# Patient Record
Sex: Male | Born: 1935 | Race: White | Hispanic: No | Smoking: Former smoker
Health system: Southern US, Community
[De-identification: ages and names within clinical notes are randomized; demographics above are authoritative.]

## PROBLEM LIST (undated history)

## (undated) DIAGNOSIS — K5790 Diverticulosis of intestine, part unspecified, without perforation or abscess without bleeding: Secondary | ICD-10-CM

## (undated) DIAGNOSIS — M719 Bursopathy, unspecified: Secondary | ICD-10-CM

## (undated) DIAGNOSIS — E785 Hyperlipidemia, unspecified: Secondary | ICD-10-CM

## (undated) DIAGNOSIS — N400 Enlarged prostate without lower urinary tract symptoms: Secondary | ICD-10-CM

## (undated) DIAGNOSIS — I4891 Unspecified atrial fibrillation: Secondary | ICD-10-CM

## (undated) DIAGNOSIS — D649 Anemia, unspecified: Secondary | ICD-10-CM

## (undated) DIAGNOSIS — M4644 Discitis, unspecified, thoracic region: Secondary | ICD-10-CM

## (undated) DIAGNOSIS — N289 Disorder of kidney and ureter, unspecified: Secondary | ICD-10-CM

## (undated) DIAGNOSIS — I10 Essential (primary) hypertension: Secondary | ICD-10-CM

## (undated) DIAGNOSIS — R609 Edema, unspecified: Secondary | ICD-10-CM

## (undated) DIAGNOSIS — K219 Gastro-esophageal reflux disease without esophagitis: Secondary | ICD-10-CM

## (undated) DIAGNOSIS — N529 Male erectile dysfunction, unspecified: Secondary | ICD-10-CM

## (undated) DIAGNOSIS — K5900 Constipation, unspecified: Secondary | ICD-10-CM

## (undated) HISTORY — PX: TOTAL KNEE ARTHROPLASTY: SHX125

## (undated) HISTORY — DX: Edema, unspecified: R60.9

## (undated) HISTORY — DX: Hyperlipidemia, unspecified: E78.5

## (undated) HISTORY — DX: Discitis, unspecified, thoracic region: M46.44

## (undated) HISTORY — DX: Unspecified atrial fibrillation: I48.91

## (undated) HISTORY — DX: Diverticulosis of intestine, part unspecified, without perforation or abscess without bleeding: K57.90

## (undated) HISTORY — DX: Bursopathy, unspecified: M71.9

## (undated) HISTORY — DX: Benign prostatic hyperplasia without lower urinary tract symptoms: N40.0

## (undated) HISTORY — PX: TONSILLECTOMY: SUR1361

## (undated) HISTORY — PX: BUNIONECTOMY: SHX129

---

## 2008-04-25 ENCOUNTER — Emergency Department (HOSPITAL_BASED_OUTPATIENT_CLINIC_OR_DEPARTMENT_OTHER): Admission: EM | Admit: 2008-04-25 | Discharge: 2008-04-25 | Payer: Self-pay | Admitting: Emergency Medicine

## 2008-04-27 ENCOUNTER — Emergency Department (HOSPITAL_BASED_OUTPATIENT_CLINIC_OR_DEPARTMENT_OTHER): Admission: EM | Admit: 2008-04-27 | Discharge: 2008-04-27 | Payer: Self-pay | Admitting: Emergency Medicine

## 2008-05-04 ENCOUNTER — Emergency Department (HOSPITAL_BASED_OUTPATIENT_CLINIC_OR_DEPARTMENT_OTHER): Admission: EM | Admit: 2008-05-04 | Discharge: 2008-05-04 | Payer: Self-pay | Admitting: Emergency Medicine

## 2008-06-03 ENCOUNTER — Emergency Department (HOSPITAL_BASED_OUTPATIENT_CLINIC_OR_DEPARTMENT_OTHER): Admission: EM | Admit: 2008-06-03 | Discharge: 2008-06-03 | Payer: Self-pay | Admitting: Emergency Medicine

## 2008-06-11 ENCOUNTER — Inpatient Hospital Stay (HOSPITAL_COMMUNITY): Admission: RE | Admit: 2008-06-11 | Discharge: 2008-06-13 | Payer: Self-pay | Admitting: Urology

## 2008-06-11 ENCOUNTER — Encounter (INDEPENDENT_AMBULATORY_CARE_PROVIDER_SITE_OTHER): Payer: Self-pay | Admitting: Urology

## 2009-03-03 ENCOUNTER — Ambulatory Visit: Payer: Self-pay | Admitting: Diagnostic Radiology

## 2009-03-03 ENCOUNTER — Emergency Department (HOSPITAL_BASED_OUTPATIENT_CLINIC_OR_DEPARTMENT_OTHER): Admission: EM | Admit: 2009-03-03 | Discharge: 2009-03-03 | Payer: Self-pay | Admitting: Emergency Medicine

## 2009-03-08 ENCOUNTER — Emergency Department (HOSPITAL_COMMUNITY): Admission: EM | Admit: 2009-03-08 | Discharge: 2009-03-08 | Payer: Self-pay | Admitting: Emergency Medicine

## 2010-06-24 ENCOUNTER — Emergency Department (HOSPITAL_BASED_OUTPATIENT_CLINIC_OR_DEPARTMENT_OTHER)
Admission: EM | Admit: 2010-06-24 | Discharge: 2010-06-24 | Payer: Self-pay | Source: Home / Self Care | Admitting: Emergency Medicine

## 2010-06-25 LAB — DIFFERENTIAL
Basophils Absolute: 0 10*3/uL (ref 0.0–0.1)
Basophils Relative: 0 % (ref 0–1)
Eosinophils Absolute: 0.2 10*3/uL (ref 0.0–0.7)
Eosinophils Relative: 3 % (ref 0–5)
Lymphocytes Relative: 23 % (ref 12–46)
Lymphs Abs: 1.6 10*3/uL (ref 0.7–4.0)
Monocytes Absolute: 0.6 10*3/uL (ref 0.1–1.0)
Monocytes Relative: 9 % (ref 3–12)
Neutro Abs: 4.3 10*3/uL (ref 1.7–7.7)
Neutrophils Relative %: 64 % (ref 43–77)

## 2010-06-25 LAB — BASIC METABOLIC PANEL
BUN: 22 mg/dL (ref 6–23)
CO2: 25 mEq/L (ref 19–32)
Calcium: 8.9 mg/dL (ref 8.4–10.5)
Chloride: 107 mEq/L (ref 96–112)
Creatinine, Ser: 1 mg/dL (ref 0.4–1.5)
GFR calc Af Amer: >60 mL/min (ref >60)
GFR calc non Af Amer: >60 mL/min (ref >60)
Glucose, Bld: 230 mg/dL — ABNORMAL HIGH (ref 70–99)
Potassium: 4.2 mEq/L (ref 3.5–5.1)
Sodium: 146 mEq/L — ABNORMAL HIGH (ref 135–145)

## 2010-06-25 LAB — CBC
HCT: 39.9 % (ref 39.0–52.0)
Hemoglobin: 13.7 g/dL (ref 13.0–17.0)
MCH: 31.4 pg (ref 26.0–34.0)
MCHC: 34.3 g/dL (ref 30.0–36.0)
MCV: 91.3 fL (ref 78.0–100.0)
Platelets: 221 10*3/uL (ref 150–400)
RBC: 4.37 MIL/uL (ref 4.22–5.81)
RDW: 12.3 % (ref 11.5–15.5)
WBC: 6.7 10*3/uL (ref 4.0–10.5)

## 2010-06-25 LAB — POCT CARDIAC MARKERS
CKMB, poc: 1.9 ng/mL (ref 1.0–8.0)
Myoglobin, poc: 57.7 ng/mL (ref 12–200)
Troponin i, poc: <0.05 ng/mL (ref 0.00–0.09)

## 2010-09-22 LAB — BASIC METABOLIC PANEL
BUN: 14 mg/dL (ref 6–23)
BUN: 15 mg/dL (ref 6–23)
CO2: 29 mEq/L (ref 19–32)
CO2: 29 mEq/L (ref 19–32)
Calcium: 7.9 mg/dL — ABNORMAL LOW (ref 8.4–10.5)
Calcium: 8.6 mg/dL (ref 8.4–10.5)
Chloride: 100 mEq/L (ref 96–112)
Chloride: 104 mEq/L (ref 96–112)
Creatinine, Ser: 1 mg/dL (ref 0.4–1.5)
Creatinine, Ser: 1.01 mg/dL (ref 0.4–1.5)
GFR calc Af Amer: >60 mL/min (ref >60)
GFR calc Af Amer: >60 mL/min (ref >60)
GFR calc non Af Amer: >60 mL/min (ref >60)
GFR calc non Af Amer: >60 mL/min (ref >60)
Glucose, Bld: 129 mg/dL — ABNORMAL HIGH (ref 70–99)
Glucose, Bld: 136 mg/dL — ABNORMAL HIGH (ref 70–99)
Potassium: 3.9 mEq/L (ref 3.5–5.1)
Potassium: 4.2 mEq/L (ref 3.5–5.1)
Sodium: 136 mEq/L (ref 135–145)
Sodium: 140 mEq/L (ref 135–145)

## 2010-09-22 LAB — CBC
HCT: 35.3 % — ABNORMAL LOW (ref 39.0–52.0)
HCT: 40.2 % (ref 39.0–52.0)
Hemoglobin: 12.2 g/dL — ABNORMAL LOW (ref 13.0–17.0)
Hemoglobin: 13.6 g/dL (ref 13.0–17.0)
MCHC: 33.8 g/dL (ref 30.0–36.0)
MCHC: 34.4 g/dL (ref 30.0–36.0)
MCV: 94.6 fL (ref 78.0–100.0)
MCV: 95.6 fL (ref 78.0–100.0)
Platelets: 197 10*3/uL (ref 150–400)
Platelets: 234 10*3/uL (ref 150–400)
RBC: 3.73 MIL/uL — ABNORMAL LOW (ref 4.22–5.81)
RBC: 4.2 MIL/uL — ABNORMAL LOW (ref 4.22–5.81)
RDW: 13 % (ref 11.5–15.5)
RDW: 13 % (ref 11.5–15.5)
WBC: 6.1 10*3/uL (ref 4.0–10.5)
WBC: 7 10*3/uL (ref 4.0–10.5)

## 2010-10-21 NOTE — Discharge Summary (Signed)
[name], Gary               ACCOUNT NO.:  [id number]   MEDICAL RECORD NO.:  [id number]          PATIENT TYPE:  INP   LOCATION:                               FACILITY:  [facility]   PHYSICIAN:  Sigmund I. Patsi Sears, M.D.DATE OF BIRTH:  21-Aug-1922   DATE OF ADMISSION:  06/11/2008  DATE OF DISCHARGE:  06/13/2008                               DISCHARGE SUMMARY   IDENTIFICATION:  This is an 77 year old gentleman with history of BPH,  status post two resections in [place].  He presented to clinic with  voiding dysfunction, was evaluated and seen to have bladder stones, as  well as regrowth of his benign prostatic hyperplasia.  After discussion  of the risks and benefits, as well as alternatives, the patient elected  to proceed with elective transurethral resection of the prostate and  removal of bladder stones.   HOSPITAL COURSE:  The patient was brought back to the operating room on  June 11, 2008, for the aforementioned procedure.  For further details  of the operation, please see the operative report dictated that day.  Postoperatively, a 22-French hematuria catheter was placed.  He did well  on hospital day #1 and #2.  His diet was advanced to a regular diet.  He  was back to his baseline level of activity.  His urine remained light  pink and cleared up nicely on postoperative day #2, at which point it  was removed and he passed a voiding trial.  At this point, he is  considered appropriate for discharge.  He was discharged home in good  condition.   PROCEDURES DURING THIS HOSPITALIZATION:  Transurethral resection of  prostate and cystolithalopaxy done on June 11, 2008.   DISCHARGE MEDICATIONS:  Please see discharge medication reconciliation  sheet.      Gary Kitten, MD      Sigmund I. Patsi Sears, M.D.  Electronically Signed    DW/MEDQ  D:  06/13/2008  T:  06/13/2008  Job:  [id number]

## 2010-10-21 NOTE — Op Note (Signed)
Gary [name], Gary [name]               ACCOUNT NO.:  [id number]   MEDICAL RECORD NO.:  [id number]          PATIENT TYPE:  INP   LOCATION:  1421                         FACILITY:  [facility]   PHYSICIAN:  Sigmund I. Patsi Sears, M.D.DATE OF BIRTH:  1922-12-17   DATE OF PROCEDURE:  06/11/2008  DATE OF DISCHARGE:                               OPERATIVE REPORT   SURGEON:  Dr. Patsi Sears   ASSISTANT:  Dr. Allena Katz.   PREOPERATIVE DIAGNOSES:  1. Recurrent benign prostatic hyperplasia, status post two previous      transurethral resections of the prostate.  2. Bladder stones.   POSTOPERATIVE DIAGNOSES:  1. Recurrent benign prostatic hyperplasia, status post two previous      transurethral resections of the prostate.  2. Bladder stones.   PROCEDURES:  1. Pan cystourethroscopy.  2. Endoscopic cystolithalopaxy.  3. Transurethral resection of the prostate.   ANESTHESIA:  General.   INDICATIONS:  This is an 77 year old gentleman with persistent voiding  dysfunction.  Urodynamics was done in the office and it showed a  slightly low capacity bladder with detrusor instability.  Furthermore,  stones were seen on fluoroscopy, as well as office cystoscopy.  He had a  component of bladder outlet obstruction as well due to diminished flow.  As such, based on the entire picture, he elected to proceed with a redo  TURP and cystolithalopaxy.  The risks and benefits were discussed with  the patient preoperatively.   PROCEDURE IN DETAIL:  The patient was brought back to the operating  room.  After the successful induction of LMA anesthetic, he was placed  in the dorsolithotomy position and all pressure points were padded  appropriately.  A preoperative timeout was performed and he received  preoperative antibiotics.   A 22-French pan cystourethroscopy was performed.  The prostatic urethra  showed evidence of bilobar hypertrophy.  There is no appreciable median  lobe.  Both ureteral orifices were identified  at the lateral aspect of  the trigone effluxing clear urine.  At the dependent portion of the  bladder, there were approximately 8-10 bladder stones, the biggest being  1-cm in diameter.   At this point, the stones were crushed using the aid of a stone crusher  and removed with endoscopic cystoscope sheath.  Once the adequate stones  had been removed, we proceeded by placing a 28-French resectoscope  sheath into the bladder.   Using the continuous flow resectoscope, we resected the prostate down to  the capsule circumferentially.  Of note, there was evidence of  dystrophic prostatic calcifications near the bladder neck that were  resected as well.  Bleeding was fulgurated.  At this point, the bladder  was inspected.  Both ureteral orifices were identified and inspected.  The bladder was drained of all the TURP fragments and residual  stones/dystrophic calcification.  At this point, a 22-French Silastic  catheter was placed.  The balloon was inflated to 30 mL of water and  light pink urine was seen effluxing.  At this point, the procedure was  ended.  The Foley was placed on traction.  Please note, Dr. Patsi Sears  was present throughout the entirety of the case.   ESTIMATED BLOOD LOSS:  Minimal.   URINE OUTPUT:  Not recorded.   DRAINS:  A #22 [place] Silastic catheter.   SPECIMENS:  1. Transurethral resection of prostate chips.  2. Bladder stone.      Delman Kitten, MD      Sigmund I. Patsi Sears, M.D.  Electronically Signed    DW/MEDQ  D:  06/11/2008  T:  06/11/2008  Job:  [id number]

## 2011-03-11 LAB — BASIC METABOLIC PANEL
BUN: 27 — ABNORMAL HIGH
CO2: 26
Calcium: 8.5
Chloride: 105
Creatinine, Ser: 1
GFR calc Af Amer: >60
GFR calc non Af Amer: >60
Glucose, Bld: 131 — ABNORMAL HIGH
Potassium: 3.9
Sodium: 139

## 2011-03-11 LAB — URINE MICROSCOPIC-ADD ON

## 2011-03-11 LAB — URINALYSIS, ROUTINE W REFLEX MICROSCOPIC
Bilirubin Urine: NEGATIVE
Glucose, UA: NEGATIVE
Ketones, ur: NEGATIVE
Leukocytes, UA: NEGATIVE
Nitrite: NEGATIVE
Protein, ur: 30 — AB
Specific Gravity, Urine: 1.018
Urobilinogen, UA: 0.2
pH: 6.5

## 2011-03-13 LAB — BASIC METABOLIC PANEL
BUN: 17 mg/dL (ref 6–23)
CO2: 25 mEq/L (ref 19–32)
Calcium: 8.7 mg/dL (ref 8.4–10.5)
Chloride: 106 mEq/L (ref 96–112)
Creatinine, Ser: 1.09 mg/dL (ref 0.4–1.5)
GFR calc Af Amer: >60 mL/min (ref >60)
GFR calc non Af Amer: >60 mL/min (ref >60)
Glucose, Bld: 168 mg/dL — ABNORMAL HIGH (ref 70–99)
Potassium: 3.9 mEq/L (ref 3.5–5.1)
Sodium: 141 mEq/L (ref 135–145)

## 2011-03-13 LAB — HEMOGLOBIN AND HEMATOCRIT, BLOOD
HCT: 39.9 % (ref 39.0–52.0)
Hemoglobin: 13.6 g/dL (ref 13.0–17.0)

## 2011-11-07 ENCOUNTER — Emergency Department (HOSPITAL_BASED_OUTPATIENT_CLINIC_OR_DEPARTMENT_OTHER)
Admission: EM | Admit: 2011-11-07 | Discharge: 2011-11-07 | Disposition: A | Discharge status: Home / Self Care | Payer: Medicare Other | Attending: Emergency Medicine | Admitting: Emergency Medicine

## 2011-11-07 ENCOUNTER — Encounter (HOSPITAL_BASED_OUTPATIENT_CLINIC_OR_DEPARTMENT_OTHER): Payer: Self-pay | Admitting: *Deleted

## 2011-11-07 DIAGNOSIS — I10 Essential (primary) hypertension: Secondary | ICD-10-CM | POA: Insufficient documentation

## 2011-11-07 DIAGNOSIS — L259 Unspecified contact dermatitis, unspecified cause: Secondary | ICD-10-CM

## 2011-11-07 DIAGNOSIS — K219 Gastro-esophageal reflux disease without esophagitis: Secondary | ICD-10-CM | POA: Insufficient documentation

## 2011-11-07 DIAGNOSIS — Z87891 Personal history of nicotine dependence: Secondary | ICD-10-CM | POA: Insufficient documentation

## 2011-11-07 DIAGNOSIS — E119 Type 2 diabetes mellitus without complications: Secondary | ICD-10-CM | POA: Insufficient documentation

## 2011-11-07 DIAGNOSIS — R21 Rash and other nonspecific skin eruption: Secondary | ICD-10-CM | POA: Insufficient documentation

## 2011-11-07 HISTORY — DX: Essential (primary) hypertension: I10

## 2011-11-07 HISTORY — DX: Gastro-esophageal reflux disease without esophagitis: K21.9

## 2011-11-07 HISTORY — DX: Male erectile dysfunction, unspecified: N52.9

## 2011-11-07 MED ORDER — DIPHENHYDRAMINE HCL 25 MG PO TABS: 25.0000 mg | ORAL_TABLET | Freq: Four times a day (QID) | ORAL | Status: AC

## 2011-11-07 MED ORDER — HYDROCORTISONE 1 % EX CREA: TOPICAL_CREAM | CUTANEOUS | Status: AC

## 2011-11-07 NOTE — ED Notes (Signed)
patient c/o itching/rash on boths hands and R thigh

## 2011-11-07 NOTE — ED Provider Notes (Signed)
History     CSN: [id number]  Arrival date & time 11/07/11  8657   First MD Initiated Contact with Patient 11/07/11 [phone]      Chief Complaint  Patient presents with  . Rash    (Consider location/radiation/quality/duration/timing/severity/associated sxs/prior treatment) HPI Pt reports he has developed a rash on his hands in the last few days. It is itchy but not painful. Rash appeared on his L anterior and medial thigh this morning. He reports he was working in the yard about 5 days ago and suspects he may have been in contact with poison ivy/oak. He has not washed the clothes he was wearing that day and has worn them again since that time.  Past Medical History  Diagnosis Date  . Diabetes mellitus   . Hypertension   . Acid reflux   . Erectile dysfunction     Past Surgical History  Procedure Date  . Total knee arthroplasty     L    No family history on file.  History  Substance Use Topics  . Smoking status: Former Games developer  . Smokeless tobacco: Not on file  . Alcohol Use: No      Review of Systems All other systems reviewed and are negative except as noted in HPI.   Allergies  Keflex  Home Medications   Current Outpatient Rx  Name Route Sig Dispense Refill  . AMLODIPINE BESYLATE 10 MG PO TABS Oral Take 10 mg by mouth daily.    . ASPIRIN 81 MG PO TABS Oral Take 81 mg by mouth daily.    . ATENOLOL 50 MG PO TABS Oral Take 50 mg by mouth daily.    Marland Kitchen DOCUSATE SODIUM 100 MG PO CAPS Oral Take 100 mg by mouth 2 (two) times daily.    Marland Kitchen GLUCOSAMINE-CHONDROITIN 500-400 MG PO TABS Oral Take 1 tablet by mouth 3 (three) times daily.    Marland Kitchen METFORMIN HCL 500 MG PO TABS Oral Take 500 mg by mouth 2 (two) times daily with a meal.    . PANTOPRAZOLE SODIUM 40 MG PO TBEC Oral Take 40 mg by mouth daily.    Marland Kitchen TAMSULOSIN HCL 0.4 MG PO CAPS Oral Take by mouth.    Randa Ngo TD Transdermal Place onto the skin.      BP 133/59  Pulse 47  Temp(Src) 98.4 F (36.9 C) (Oral)  Resp 18   SpO2 99%  Physical Exam  Nursing note and vitals reviewed. Constitutional: He is oriented to person, place, and time. He appears well-developed and well-nourished.  HENT:  Head: Normocephalic and atraumatic.  Eyes: EOM are normal. Pupils are equal, round, and reactive to light.  Neck: Normal range of motion. Neck supple.  Cardiovascular: Normal rate, normal heart sounds and intact distal pulses.   Pulmonary/Chest: Effort normal and breath sounds normal.  Abdominal: Bowel sounds are normal. He exhibits no distension. There is no tenderness.  Musculoskeletal: Normal range of motion. He exhibits no edema and no tenderness.  Neurological: He is alert and oriented to person, place, and time. He has normal strength. No cranial nerve deficit or sensory deficit.  Skin: Skin is warm and dry. Rash (erythematous rash with small vessicles on hands, particularly in webspaces and also on anterior/medial thigh) noted.  Psychiatric: He has a normal mood and affect.    ED Course  Procedures (including critical care time)     MDM  Rash is in a non-dermatomal distribution and is not characteristic of zoster. Rash has the appearance of  a contact dermatitis, unsure of source but suspect poison ivy/oak. Pt is diabetic, so will avoid systemic steroids for now. Treat with topical hydrocortisone and oral antihistamines.         Jakayla Schweppe B. Bernette Mayers, MD 11/07/11 2130

## 2011-11-07 NOTE — Discharge Instructions (Signed)

## 2012-04-01 ENCOUNTER — Encounter (HOSPITAL_BASED_OUTPATIENT_CLINIC_OR_DEPARTMENT_OTHER): Payer: Self-pay | Admitting: *Deleted

## 2012-04-01 ENCOUNTER — Emergency Department (HOSPITAL_BASED_OUTPATIENT_CLINIC_OR_DEPARTMENT_OTHER)
Admission: EM | Admit: 2012-04-01 | Discharge: 2012-04-01 | Disposition: A | Discharge status: Home / Self Care | Payer: Medicare Other | Attending: Emergency Medicine | Admitting: Emergency Medicine

## 2012-04-01 DIAGNOSIS — Z87891 Personal history of nicotine dependence: Secondary | ICD-10-CM | POA: Insufficient documentation

## 2012-04-01 DIAGNOSIS — N529 Male erectile dysfunction, unspecified: Secondary | ICD-10-CM | POA: Insufficient documentation

## 2012-04-01 DIAGNOSIS — Z8719 Personal history of other diseases of the digestive system: Secondary | ICD-10-CM | POA: Insufficient documentation

## 2012-04-01 DIAGNOSIS — I10 Essential (primary) hypertension: Secondary | ICD-10-CM | POA: Insufficient documentation

## 2012-04-01 DIAGNOSIS — Z87448 Personal history of other diseases of urinary system: Secondary | ICD-10-CM | POA: Insufficient documentation

## 2012-04-01 DIAGNOSIS — Z79899 Other long term (current) drug therapy: Secondary | ICD-10-CM | POA: Insufficient documentation

## 2012-04-01 DIAGNOSIS — K5900 Constipation, unspecified: Secondary | ICD-10-CM

## 2012-04-01 DIAGNOSIS — Z7982 Long term (current) use of aspirin: Secondary | ICD-10-CM | POA: Insufficient documentation

## 2012-04-01 DIAGNOSIS — Z9889 Other specified postprocedural states: Secondary | ICD-10-CM | POA: Insufficient documentation

## 2012-04-01 DIAGNOSIS — E119 Type 2 diabetes mellitus without complications: Secondary | ICD-10-CM | POA: Insufficient documentation

## 2012-04-01 DIAGNOSIS — K219 Gastro-esophageal reflux disease without esophagitis: Secondary | ICD-10-CM | POA: Insufficient documentation

## 2012-04-01 MED ORDER — FLEET ENEMA 7-19 GM/118ML RE ENEM: 1.0000 | ENEMA | Freq: Once | RECTAL | Status: DC

## 2012-04-01 MED ORDER — MINERAL OIL RE ENEM
1.0000 | ENEMA | Freq: Once | RECTAL | Status: AC
  Administered 2012-04-01: 1 via RECTAL
  Filled 2012-04-01: qty 1

## 2012-04-01 NOTE — ED Notes (Signed)
Constipation. Last bowel movement 3 days ago. Lives at [name] Took 3 stool softeners without results.

## 2012-04-01 NOTE — ED Provider Notes (Addendum)
History     CSN: [id number]  Arrival date & time 04/01/12  1543   First MD Initiated Contact with Patient 04/01/12 1608      Chief Complaint  Patient presents with  . Constipation    (Consider location/radiation/quality/duration/timing/severity/associated sxs/prior treatment) HPI Comments: Patient recently went on vacation where he was not exercising regularly and his diet was different. He thinks this is most likely the reason for his constipation. He states that he took stool softeners yesterday and today without any bowel movements. He states in the past he is taking Colace and that has worked but he did not have any available.  Patient is a 78 y.o. male presenting with constipation. The history is provided by the patient.  Constipation  The current episode started 3 to 5 days ago. The onset was gradual. The problem occurs continuously. The problem has been gradually worsening. The patient is experiencing no pain. The stool is described as hard. Prior successful therapies include laxatives. Prior unsuccessful therapies include stool softeners. Pertinent negatives include no anorexia, no fever, no abdominal pain, no diarrhea, no nausea, no rectal pain and no vomiting. He has been eating and drinking normally. The last void occurred less than 6 hours ago.    Past Medical History  Diagnosis Date  . Diabetes mellitus   . Hypertension   . Acid reflux   . Erectile dysfunction     Past Surgical History  Procedure Date  . Total knee arthroplasty     L    No family history on file.  History  Substance Use Topics  . Smoking status: Former Games developer  . Smokeless tobacco: Not on file  . Alcohol Use: No      Review of Systems  Constitutional: Negative for fever.  Gastrointestinal: Positive for constipation. Negative for nausea, vomiting, abdominal pain, diarrhea, rectal pain and anorexia.  All other systems reviewed and are negative.    Allergies  Keflex  Home Medications    Current Outpatient Rx  Name Route Sig Dispense Refill  . AMLODIPINE BESYLATE 10 MG PO TABS Oral Take 10 mg by mouth daily.    . ASPIRIN 81 MG PO TABS Oral Take 81 mg by mouth daily.    . ATENOLOL 50 MG PO TABS Oral Take 50 mg by mouth daily.    Marland Kitchen DIPHENHYDRAMINE HCL 25 MG PO TABS Oral Take 1 tablet (25 mg total) by mouth every 6 (six) hours. 20 tablet 0  . DOCUSATE SODIUM 100 MG PO CAPS Oral Take 100 mg by mouth 2 (two) times daily.    Marland Kitchen GLUCOSAMINE-CHONDROITIN 500-400 MG PO TABS Oral Take 1 tablet by mouth 3 (three) times daily.    Marland Kitchen HYDROCORTISONE 1 % EX CREA  Apply to affected area 2 times daily 15 g 0  . METFORMIN HCL 500 MG PO TABS Oral Take 500 mg by mouth 2 (two) times daily with a meal.    . PANTOPRAZOLE SODIUM 40 MG PO TBEC Oral Take 40 mg by mouth daily.    Marland Kitchen TAMSULOSIN HCL 0.4 MG PO CAPS Oral Take by mouth.    Randa Ngo TD Transdermal Place onto the skin.      BP 118/65  Pulse 62  Temp 97.7 F (36.5 C) (Oral)  Resp 16  Ht 5\' 10"  (1.778 m)  Wt 178 lb (80.74 kg)  BMI 25.54 kg/m2  SpO2 96%  Physical Exam  Nursing note and vitals reviewed. Constitutional: He is oriented to person, place, and time. He appears  well-developed and well-nourished. No distress.  HENT:  Head: Normocephalic and atraumatic.  Mouth/Throat: Oropharynx is clear and moist.  Eyes: Conjunctivae normal and EOM are normal. Pupils are equal, round, and reactive to light.  Neck: Normal range of motion. Neck supple.  Cardiovascular: Normal rate, regular rhythm and intact distal pulses.   No murmur heard. Pulmonary/Chest: Effort normal and breath sounds normal. No respiratory distress. He has no wheezes. He has no rales.  Abdominal: Soft. He exhibits no distension. There is no tenderness. There is no rebound and no guarding.  Genitourinary:       Large amount of hard stool at the fingertip  Musculoskeletal: Normal range of motion. He exhibits no edema and no tenderness.  Neurological: He is alert and  oriented to person, place, and time.  Skin: Skin is warm and dry. No rash noted. No erythema.  Psychiatric: He has a normal mood and affect. His behavior is normal.    ED Course  Procedures (including critical care time)  Labs Reviewed - No data to display No results found.   1. Constipation       MDM   Patient with symptoms today most consistent with uncomplicated constipation and fecal impaction. Large amount of hard stool it fingertip was unable to be removed manually. Patient has no abdominal tenderness is not vomiting and is otherwise well-appearing. Will give enema to aid patient in removing large stool ball.  5:26 PM Pt had a large BM after enema and feels much better.  Since he had significant relief will have him continue stool softeners.     Gwyneth Sprout, MD 04/01/12 1727  Gwyneth Sprout, MD 04/01/12 1729

## 2012-12-05 ENCOUNTER — Ambulatory Visit (INDEPENDENT_AMBULATORY_CARE_PROVIDER_SITE_OTHER): Payer: Medicare Other | Admitting: Cardiology

## 2012-12-05 ENCOUNTER — Encounter: Payer: Self-pay | Admitting: Cardiology

## 2012-12-05 VITALS — BP 141/77 | HR 38 | Wt 174.0 lb

## 2012-12-05 DIAGNOSIS — R0602 Shortness of breath: Secondary | ICD-10-CM

## 2012-12-05 DIAGNOSIS — K219 Gastro-esophageal reflux disease without esophagitis: Secondary | ICD-10-CM | POA: Insufficient documentation

## 2012-12-05 DIAGNOSIS — R609 Edema, unspecified: Secondary | ICD-10-CM

## 2012-12-05 DIAGNOSIS — I4891 Unspecified atrial fibrillation: Secondary | ICD-10-CM | POA: Insufficient documentation

## 2012-12-05 DIAGNOSIS — N529 Male erectile dysfunction, unspecified: Secondary | ICD-10-CM | POA: Insufficient documentation

## 2012-12-05 DIAGNOSIS — M4644 Discitis, unspecified, thoracic region: Secondary | ICD-10-CM | POA: Insufficient documentation

## 2012-12-05 DIAGNOSIS — I10 Essential (primary) hypertension: Secondary | ICD-10-CM

## 2012-12-05 MED ORDER — FUROSEMIDE 20 MG PO TABS: 40.0000 mg | ORAL_TABLET | Freq: Every day | ORAL | Status: DC

## 2012-12-05 MED ORDER — ATENOLOL 25 MG PO TABS: 25.0000 mg | ORAL_TABLET | Freq: Two times a day (BID) | ORAL | Status: DC

## 2012-12-05 NOTE — Assessment & Plan Note (Signed)
Continue present blood pressure medications but decrease atenolol because of bradycardia.

## 2012-12-05 NOTE — Progress Notes (Signed)
HPI: 79 year old male for evaluation of atrial fibrillation. Patient states that for the past 2 months he has had bilateral lower extremity edema. He has been placed on low-dose diuretics but his edema has persisted. He also notes mild increased dyspnea with more vigorous activities but not routine activities. No orthopnea, PND, chest pain, palpitations or syncope. He was seen by his primary care physician and also noted to be in atrial fibrillation and cardiology asked to evaluate.  Current Outpatient Prescriptions  Medication Sig Dispense Refill  . amLODipine (NORVASC) 10 MG tablet Take 10 mg by mouth daily.      Marland Kitchen atenolol (TENORMIN) 25 MG tablet Take 1 tablet (25 mg total) by mouth 2 (two) times daily.      . diphenhydrAMINE (BENADRYL) 25 MG tablet Take 1 tablet (25 mg total) by mouth every 6 (six) hours.  20 tablet  0  . docusate sodium (COLACE) 100 MG capsule Take 100 mg by mouth 2 (two) times daily.      . furosemide (LASIX) 20 MG tablet Take 2 tablets (40 mg total) by mouth daily.  30 tablet    . glucosamine-chondroitin 500-400 MG tablet Take 1 tablet by mouth 3 (three) times daily.      . metFORMIN (GLUCOPHAGE) 500 MG tablet Take 500 mg by mouth 2 (two) times daily with a meal.      . pantoprazole (PROTONIX) 40 MG tablet Take 40 mg by mouth daily.      . Tamsulosin HCl (FLOMAX) 0.4 MG CAPS Take 0.4 mg by mouth daily.        No current facility-administered medications for this visit.    Allergies  Allergen Reactions  . Keflex (Cephalexin)     Past Medical History  Diagnosis Date  . Diabetes mellitus   . Hypertension   . Acid reflux   . Erectile dysfunction   . Atrial fibrillation   . Bursitis   . Edema   . Thoracic discitis   . Hyperlipidemia   . Diverticulosis   . BPH (benign prostatic hyperplasia)     Past Surgical History  Procedure Laterality Date  . Total knee arthroplasty      L  . Tonsillectomy      History   Social History  . Marital Status: Widowed      Spouse Name: N/A    Number of Children: 3  . Years of Education: N/A   Occupational History  .     Social History Main Topics  . Smoking status: Former Games developer  . Smokeless tobacco: Not on file  . Alcohol Use: Yes     Comment: Occasional  . Drug Use: No  . Sexually Active: Not on file   Other Topics Concern  . Not on file   Social History Narrative  . No narrative on file    Family History  Problem Relation Age of Onset  . Heart disease Mother     ROS: no fevers or chills, productive cough, hemoptysis, dysphasia, odynophagia, melena, hematochezia, dysuria, hematuria, rash, seizure activity, orthopnea, PND, claudication. Remaining systems are negative.  Physical Exam:   Blood pressure 141/77, pulse 38, weight 174 lb (78.926 kg).  General:  Well developed/well nourished in NAD Skin warm/dry Patient not depressed No peripheral clubbing Back-normal HEENT-normal/normal eyelids Neck supple/normal carotid upstroke bilaterally; no bruits; no JVD; no thyromegaly chest - CTA/ normal expansion CV - irregular, bradycardic/normal S1 and S2; no rubs or gallops;  PMI nondisplaced; 1-2/6 systolic murmur apex. Abdomen -NT/ND, no HSM,  no mass, + bowel sounds, no bruit 2+ femoral pulses, no bruits Ext-2+ edema to mid tibia, no chords, 2+ DP Neuro-grossly nonfocal  ECG atrial fibrillation with a slow ventricular response, rate 38, no ST changes.

## 2012-12-05 NOTE — Assessment & Plan Note (Signed)
Change Lasix to 40 mg daily. Check potassium and renal function in one week.

## 2012-12-05 NOTE — Assessment & Plan Note (Signed)
The patient presents with new diagnosis of atrial fibrillation. His heart rate is slow. Decrease atenolol to 25 mg by mouth twice a day. He may ultimately need to have this medication discontinued. He has embolic risk factors of age greater than 24, hypertension and diabetes. Discontinue aspirin. Check renal function. Begin apixaban ( dose will need to be based on creatinine). Check TSH, renal function, hemoglobin and BNP. Check echocardiogram. Patient does have some dyspnea on exertion and pedal edema. Change Lasix to 40 mg daily. Repeat potassium and renal function in one week. We will decide about rate control/anticoagulation versus rhythm control when he follows up in 4 weeks. If he continues to have some dyspnea and edema despite diuresis and we may attempt cardioversion to see if he will hold sinus rhythm. We we'll plan to recheck renal function and hemoglobin in 4 weeks when he returns. He does have a history of a diverticular bleed but has had no hematochezia in several years.

## 2012-12-05 NOTE — Patient Instructions (Addendum)
Your physician recommends that you schedule a follow-up appointment in: 4 WEEKS WITH DR Jens Som   Your physician has requested that you have an echocardiogram. Echocardiography is a painless test that uses sound waves to create images of your heart. It provides your doctor with information about the size and shape of your heart and how well your heart's chambers and valves are working. This procedure takes approximately one hour. There are no restrictions for this procedure.   Your physician recommends that you HAVE LAB WORK TODAY  INCREASE FUROSEMIDE TO 40 MG ONCE DAILY (TWO 20 MG TABLETS ONCE DAILY)  Your physician recommends that you return for lab work in: ONE WEEK  STOP ASPIRIN  WILL CALL ABOUT STARTING ELIQUIS AFTER LAB WORK IS REVIEWED  DECREASE ATENOLOL TO 25 MG TWICE DAILY (1/2 50 MG TABLET TWICE DAILY)

## 2012-12-06 LAB — BASIC METABOLIC PANEL
CO2: 24 mEq/L (ref 19–32)
Calcium: 8.8 mg/dL (ref 8.4–10.5)
Chloride: 106 mEq/L (ref 96–112)
Creatinine, Ser: 1.4 mg/dL (ref 0.4–1.5)
Sodium: 141 mEq/L (ref 135–145)

## 2012-12-06 LAB — CBC WITH DIFFERENTIAL/PLATELET
Basophils Absolute: 0 10*3/uL (ref 0.0–0.1)
Eosinophils Absolute: 0.3 10*3/uL (ref 0.0–0.7)
HCT: 36.8 % — ABNORMAL LOW (ref 39.0–52.0)
Hemoglobin: 12.4 g/dL — ABNORMAL LOW (ref 13.0–17.0)
Lymphocytes Relative: 25.5 % (ref 12.0–46.0)
Lymphs Abs: 1.4 10*3/uL (ref 0.7–4.0)
MCV: 93.2 fl (ref 78.0–100.0)
Monocytes Relative: 10.4 % (ref 3.0–12.0)
Platelets: 251 10*3/uL (ref 150.0–400.0)
RBC: 3.95 Mil/uL — ABNORMAL LOW (ref 4.22–5.81)
WBC: 5.4 10*3/uL (ref 4.5–10.5)

## 2012-12-06 LAB — BRAIN NATRIURETIC PEPTIDE: Pro B Natriuretic peptide (BNP): 314 pg/mL — ABNORMAL HIGH (ref 0.0–100.0)

## 2012-12-06 LAB — TSH: TSH: 2.03 u[IU]/mL (ref 0.35–5.50)

## 2012-12-14 ENCOUNTER — Other Ambulatory Visit: Payer: Self-pay | Admitting: *Deleted

## 2012-12-14 ENCOUNTER — Other Ambulatory Visit (INDEPENDENT_AMBULATORY_CARE_PROVIDER_SITE_OTHER): Payer: Medicare Other

## 2012-12-14 ENCOUNTER — Ambulatory Visit (HOSPITAL_COMMUNITY): Payer: Medicare Other | Attending: Cardiology | Admitting: Radiology

## 2012-12-14 DIAGNOSIS — I4891 Unspecified atrial fibrillation: Secondary | ICD-10-CM | POA: Insufficient documentation

## 2012-12-14 DIAGNOSIS — E119 Type 2 diabetes mellitus without complications: Secondary | ICD-10-CM | POA: Insufficient documentation

## 2012-12-14 DIAGNOSIS — I079 Rheumatic tricuspid valve disease, unspecified: Secondary | ICD-10-CM | POA: Insufficient documentation

## 2012-12-14 DIAGNOSIS — E785 Hyperlipidemia, unspecified: Secondary | ICD-10-CM | POA: Insufficient documentation

## 2012-12-14 LAB — BASIC METABOLIC PANEL
CO2: 30 mEq/L (ref 19–32)
Calcium: 9.3 mg/dL (ref 8.4–10.5)
Creatinine, Ser: 1.7 mg/dL — ABNORMAL HIGH (ref 0.4–1.5)
GFR: 40.88 mL/min — ABNORMAL LOW (ref >60.00)
Sodium: 144 mEq/L (ref 135–145)

## 2012-12-14 MED ORDER — APIXABAN 5 MG PO TABS: 5.0000 mg | ORAL_TABLET | Freq: Two times a day (BID) | ORAL | Status: DC

## 2012-12-14 NOTE — Progress Notes (Signed)
Echocardiogram performed.  

## 2012-12-19 ENCOUNTER — Telehealth: Payer: Self-pay | Admitting: Cardiology

## 2012-12-19 ENCOUNTER — Other Ambulatory Visit: Payer: Self-pay | Admitting: *Deleted

## 2012-12-19 DIAGNOSIS — N289 Disorder of kidney and ureter, unspecified: Secondary | ICD-10-CM

## 2012-12-19 DIAGNOSIS — I4891 Unspecified atrial fibrillation: Secondary | ICD-10-CM

## 2012-12-19 MED ORDER — FUROSEMIDE 20 MG PO TABS: ORAL_TABLET | ORAL | Status: DC

## 2012-12-19 MED ORDER — APIXABAN 2.5 MG PO TABS: 2.5000 mg | ORAL_TABLET | Freq: Two times a day (BID) | ORAL | Status: DC

## 2012-12-19 NOTE — Telephone Encounter (Signed)
Follow Up ° ° ° ° °Pt calling following up on test results. Please call. °

## 2012-12-19 NOTE — Telephone Encounter (Signed)
Spoke with pt, aware of recent labs, med change, and echo results

## 2012-12-28 ENCOUNTER — Other Ambulatory Visit (INDEPENDENT_AMBULATORY_CARE_PROVIDER_SITE_OTHER): Payer: Medicare Other

## 2012-12-28 DIAGNOSIS — N289 Disorder of kidney and ureter, unspecified: Secondary | ICD-10-CM

## 2012-12-28 DIAGNOSIS — I4891 Unspecified atrial fibrillation: Secondary | ICD-10-CM

## 2012-12-28 LAB — BASIC METABOLIC PANEL
Chloride: 103 mEq/L (ref 96–112)
GFR: 39.78 mL/min — ABNORMAL LOW (ref >60.00)
Potassium: 4.5 mEq/L (ref 3.5–5.1)
Sodium: 140 mEq/L (ref 135–145)

## 2013-01-06 ENCOUNTER — Other Ambulatory Visit: Payer: Medicare Other

## 2013-01-06 ENCOUNTER — Encounter: Payer: Self-pay | Admitting: Cardiology

## 2013-01-06 ENCOUNTER — Ambulatory Visit (INDEPENDENT_AMBULATORY_CARE_PROVIDER_SITE_OTHER): Payer: Medicare Other | Admitting: Cardiology

## 2013-01-06 VITALS — BP 117/56 | HR 41 | Ht 70.0 in | Wt 165.6 lb

## 2013-01-06 DIAGNOSIS — R609 Edema, unspecified: Secondary | ICD-10-CM

## 2013-01-06 DIAGNOSIS — I4891 Unspecified atrial fibrillation: Secondary | ICD-10-CM

## 2013-01-06 DIAGNOSIS — I10 Essential (primary) hypertension: Secondary | ICD-10-CM

## 2013-01-06 LAB — CBC WITH DIFFERENTIAL/PLATELET
Basophils Absolute: 0 10*3/uL (ref 0.0–0.1)
Eosinophils Relative: 3.2 % (ref 0.0–5.0)
HCT: 43.2 % (ref 39.0–52.0)
Hemoglobin: 14.1 g/dL (ref 13.0–17.0)
Lymphs Abs: 1.4 10*3/uL (ref 0.7–4.0)
MCV: 94.2 fl (ref 78.0–100.0)
Monocytes Absolute: 0.5 10*3/uL (ref 0.1–1.0)
Monocytes Relative: 8.4 % (ref 3.0–12.0)
Neutro Abs: 4.3 10*3/uL (ref 1.4–7.7)
Platelets: 215 10*3/uL (ref 150.0–400.0)
RDW: 14.4 % (ref 11.5–14.6)

## 2013-01-06 LAB — BASIC METABOLIC PANEL
CO2: 33 mEq/L — ABNORMAL HIGH (ref 19–32)
Calcium: 9.3 mg/dL (ref 8.4–10.5)
Creatinine, Ser: 1.7 mg/dL — ABNORMAL HIGH (ref 0.4–1.5)
GFR: 41.15 mL/min — ABNORMAL LOW (ref >60.00)
Glucose, Bld: 123 mg/dL — ABNORMAL HIGH (ref 70–99)
Sodium: 140 mEq/L (ref 135–145)

## 2013-01-06 NOTE — Progress Notes (Signed)
   HPI: 79 year old male for fu of atrial fibrillation. Echocardiogram in July of 2014 showed normal LV function, mild aortic stenosis with a mean gradient of 14 mm of mercury. There was moderate left ventricular hypertrophy. TSH in June of 2014 normal. Since I last saw him he has some dyspnea on exertion but no orthopnea, PND, chest pain, palpitations or syncope. His pedal edema has improved.   Current Outpatient Prescriptions  Medication Sig Dispense Refill  . amLODipine (NORVASC) 10 MG tablet Take 10 mg by mouth daily.      Marland Kitchen apixaban (ELIQUIS) 2.5 MG TABS tablet Take 1 tablet (2.5 mg total) by mouth 2 (two) times daily.  60 tablet  6  . atenolol (TENORMIN) 25 MG tablet Take 1 tablet (25 mg total) by mouth 2 (two) times daily.      Marland Kitchen docusate sodium (COLACE) 100 MG capsule Take 100 mg by mouth 2 (two) times daily.      . furosemide (LASIX) 20 MG tablet 40 alternating with 20 mg po daily  60 tablet  2  . glucosamine-chondroitin 500-400 MG tablet Take 1 tablet by mouth 3 (three) times daily.      . metFORMIN (GLUCOPHAGE) 500 MG tablet Take 500 mg by mouth 2 (two) times daily with a meal.      . pantoprazole (PROTONIX) 40 MG tablet Take 40 mg by mouth daily.      . Tamsulosin HCl (FLOMAX) 0.4 MG CAPS Take 0.4 mg by mouth daily.       . diphenhydrAMINE (BENADRYL) 25 MG tablet Take 1 tablet (25 mg total) by mouth every 6 (six) hours.  20 tablet  0   No current facility-administered medications for this visit.     Past Medical History  Diagnosis Date  . Diabetes mellitus   . Hypertension   . Acid reflux   . Erectile dysfunction   . Atrial fibrillation   . Bursitis   . Edema   . Thoracic discitis   . Hyperlipidemia   . Diverticulosis   . BPH (benign prostatic hyperplasia)     Past Surgical History  Procedure Laterality Date  . Total knee arthroplasty      L  . Tonsillectomy      History   Social History  . Marital Status: Widowed    Spouse Name: N/A    Number of Children:  3  . Years of Education: N/A   Occupational History  .     Social History Main Topics  . Smoking status: Former Games developer  . Smokeless tobacco: Not on file  . Alcohol Use: Yes     Comment: Occasional  . Drug Use: No  . Sexually Active: Not on file   Other Topics Concern  . Not on file   Social History Narrative  . No narrative on file    ROS: no fevers or chills, productive cough, hemoptysis, dysphasia, odynophagia, melena, hematochezia, dysuria, hematuria, rash, seizure activity, orthopnea, PND, pedal edema, claudication. Remaining systems are negative.  Physical Exam: Well-developed well-nourished in no acute distress.  Skin is warm and dry.  HEENT is normal.  Neck is supple.  Chest is clear to auscultation with normal expansion.  Cardiovascular exam is bradycardic and irregular Abdominal exam nontender or distended. No masses palpated. Extremities show trace edema. neuro grossly intact  ECG atrial fibrillation with slow ventricular response at 41. Nonspecific ST changes.

## 2013-01-06 NOTE — Assessment & Plan Note (Signed)
Discontinue atenolol due to bradycardia. Continue Norvasc.

## 2013-01-06 NOTE — Assessment & Plan Note (Signed)
Continue Lasix. Check potassium and renal function. 

## 2013-01-06 NOTE — Assessment & Plan Note (Signed)
Patient remains in atrial fibrillation today. His heart rate remains decreased. Discontinue atenolol. Continue apixaban. Check potassium, renal function and hemoglobin. Options include rate control and anticoagulation versus attempt at cardioversion/rhythm control. He would prefer the former. His volume status has improved since last visit. Continue Lasix. We may decrease dose pending followup laboratories.

## 2013-01-06 NOTE — Patient Instructions (Addendum)
Your physician wants you to follow-up in: 3 MONTHS WITH DR Jens Som You will receive a reminder letter in the mail two months in advance. If you don't receive a letter, please call our office to schedule the follow-up appointment.   STOP ATENOLOL  Your physician recommends that you HAVE LAB WORK TODAY

## 2013-01-11 ENCOUNTER — Other Ambulatory Visit: Payer: 59

## 2013-01-25 ENCOUNTER — Telehealth: Payer: Self-pay | Admitting: Cardiology

## 2013-01-25 NOTE — Telephone Encounter (Signed)
Left message for pt to call.

## 2013-01-25 NOTE — Telephone Encounter (Signed)
New problem   Pt want to know results of his blood test. Pt returning your call.

## 2013-01-26 NOTE — Telephone Encounter (Signed)
New problem   Pt need to speak to nurse because his Urologist told him he should be taking Asprin and Dr Jens Som told him not to take it. Please call pt he is confused

## 2013-01-26 NOTE — Telephone Encounter (Signed)
Left message for pt to call.

## 2013-01-26 NOTE — Telephone Encounter (Signed)
Change lasix to 20 mg daily; bmet one week Brian Crenshaw  

## 2013-01-26 NOTE — Telephone Encounter (Signed)
Spoke with pt, aware he does not need aspirin because he is taking eliquis. Pt voiced understanding. Also I have been unable to discuss the pots lab work from the first of the month. He reports he takes furosemide 40 mg one day and 20 mg the next day. He saw dr Patsi Sears yesterday. They did not do blood work and did not make any changes in his meds. Will make dfr crenshaw aware of furosemide dosage

## 2013-02-01 NOTE — Telephone Encounter (Signed)
Left message for pt to call.

## 2013-02-03 ENCOUNTER — Telehealth: Payer: Self-pay | Admitting: Cardiology

## 2013-02-03 DIAGNOSIS — N289 Disorder of kidney and ureter, unspecified: Secondary | ICD-10-CM

## 2013-02-03 MED ORDER — FUROSEMIDE 20 MG PO TABS: 20.0000 mg | ORAL_TABLET | Freq: Every day | ORAL | Status: DC

## 2013-02-03 NOTE — Telephone Encounter (Signed)
Spoke with pt, due to the elevated kidney function on the last bmp, per dr Jens Som the pt was instructed to decrease furosemide to 20 mg daily. He will have a bmp in one week

## 2013-02-03 NOTE — Telephone Encounter (Signed)
Spoke with pt, aware to decrease furosemide to 20 mg daily. He will have repeat labs in one week.

## 2013-02-03 NOTE — Telephone Encounter (Signed)
Pt rtn call to [employer]

## 2013-08-06 ENCOUNTER — Emergency Department (HOSPITAL_BASED_OUTPATIENT_CLINIC_OR_DEPARTMENT_OTHER)
Admission: EM | Admit: 2013-08-06 | Discharge: 2013-08-06 | Disposition: A | Discharge status: Home / Self Care | Payer: Medicare Other | Attending: Emergency Medicine | Admitting: Emergency Medicine

## 2013-08-06 ENCOUNTER — Encounter (HOSPITAL_BASED_OUTPATIENT_CLINIC_OR_DEPARTMENT_OTHER): Payer: Self-pay | Admitting: Emergency Medicine

## 2013-08-06 DIAGNOSIS — N400 Enlarged prostate without lower urinary tract symptoms: Secondary | ICD-10-CM | POA: Insufficient documentation

## 2013-08-06 DIAGNOSIS — Z79899 Other long term (current) drug therapy: Secondary | ICD-10-CM | POA: Insufficient documentation

## 2013-08-06 DIAGNOSIS — Z8739 Personal history of other diseases of the musculoskeletal system and connective tissue: Secondary | ICD-10-CM | POA: Insufficient documentation

## 2013-08-06 DIAGNOSIS — Z87891 Personal history of nicotine dependence: Secondary | ICD-10-CM | POA: Insufficient documentation

## 2013-08-06 DIAGNOSIS — E785 Hyperlipidemia, unspecified: Secondary | ICD-10-CM | POA: Insufficient documentation

## 2013-08-06 DIAGNOSIS — K219 Gastro-esophageal reflux disease without esophagitis: Secondary | ICD-10-CM | POA: Insufficient documentation

## 2013-08-06 DIAGNOSIS — I10 Essential (primary) hypertension: Secondary | ICD-10-CM | POA: Insufficient documentation

## 2013-08-06 DIAGNOSIS — K5900 Constipation, unspecified: Secondary | ICD-10-CM | POA: Insufficient documentation

## 2013-08-06 DIAGNOSIS — I4891 Unspecified atrial fibrillation: Secondary | ICD-10-CM | POA: Insufficient documentation

## 2013-08-06 DIAGNOSIS — E119 Type 2 diabetes mellitus without complications: Secondary | ICD-10-CM | POA: Insufficient documentation

## 2013-08-06 MED ORDER — POLYETHYLENE GLYCOL 3350 17 G PO PACK: PACK | ORAL | Status: AC

## 2013-08-06 MED ORDER — MINERAL OIL RE ENEM
1.0000 | ENEMA | Freq: Once | RECTAL | Status: AC
Start: 2013-08-06 — End: 2013-08-06
  Administered 2013-08-06: 1 via RECTAL
  Filled 2013-08-06: qty 1

## 2013-08-06 NOTE — Discharge Instructions (Signed)
Constipation, Adult  Constipation is when a person:  · Poops (bowel movement) less than 3 times a week.  · Has a hard time pooping.  · Has poop that is dry, hard, or bigger than normal.  HOME CARE   · Eat more fiber, such as fruits, vegetables, whole grains like brown rice, and beans.  · Eat less fatty foods and sugar. This includes French fries, hamburgers, cookies, candy, and soda.  · If you are not getting enough fiber from food, take products with added fiber in them (supplements).  · Drink enough fluid to keep your pee (urine) clear or pale yellow.  · Go to the restroom when you feel like you need to poop. Do not hold it.  · Only take medicine as told by your doctor. Do not take medicines that help you poop (laxatives) without talking to your doctor first.  · Exercise on a regular basis, or as told by your doctor.  GET HELP RIGHT AWAY IF:   · You have bright red blood in your poop (stool).  · Your constipation lasts more than 4 days or gets worse.  · You have belly (abdomen) or butt (rectal) pain.  · You have thin poop (as thin as a pencil).  · You lose weight, and it cannot be explained.  MAKE SURE YOU:   · Understand these instructions.  · Will watch your condition.  · Will get help right away if you are not doing well or get worse.  Document Released: 11/11/2007 Document Revised: 08/17/2011 Document Reviewed: 03/06/2013  ExitCare® Patient Information ©2014 [employer].

## 2013-08-06 NOTE — ED Notes (Signed)
Pt states he took some imodium recently and has caused constipation.  Pt states he  Requires an enema.

## 2013-08-06 NOTE — ED Provider Notes (Signed)
CSN: [id number]     Arrival date & time 08/06/13  [id number] History   First MD Initiated Contact with Patient 08/06/13 [phone]     Chief Complaint  Patient presents with  . Constipation      HPI She presents emergency Department chief complaint constipation.  Last bowel movement was 2-3 days ago.  Patient has had the feeling of need to have a bowel movement but hasn't been able to pass it.  Has had this in the past required enema.  Patient did take some Imodium last week because of loose bowels prior to the onset of this constipation.  Patient denies abdominal distention or nausea, vomiting.  Patient denies fever. Past Medical History  Diagnosis Date  . Diabetes mellitus   . Hypertension   . Acid reflux   . Erectile dysfunction   . Atrial fibrillation   . Bursitis   . Edema   . Thoracic discitis   . Hyperlipidemia   . Diverticulosis   . BPH (benign prostatic hyperplasia)    Past Surgical History  Procedure Laterality Date  . Total knee arthroplasty      L  . Tonsillectomy     Family History  Problem Relation Age of Onset  . Heart disease Mother    History  Substance Use Topics  . Smoking status: Former Games developermoker  . Smokeless tobacco: Not on file  . Alcohol Use: Yes     Comment: Occasional    Review of Systems  All other systems reviewed and are negative  Allergies  Ibuprofen and Keflex  Home Medications   Current Outpatient Rx  Name  Route  Sig  Dispense  Refill  . albuterol (PROVENTIL HFA;VENTOLIN HFA) 108 (90 BASE) MCG/ACT inhaler   Inhalation   Inhale 2 puffs into the lungs every 6 (six) hours as needed for wheezing or shortness of breath.         . bisacodyl (DULCOLAX) 5 MG EC tablet   Oral   Take 5 mg by mouth daily as needed for moderate constipation.         Marland Kitchen. diltiazem (DILACOR XR) 120 MG 24 hr capsule   Oral   Take 120 mg by mouth daily.         . diphenhydrAMINE (BENADRYL) 25 MG tablet   Oral   Take 1 tablet (25 mg total) by mouth every 6 (six)  hours.   20 tablet   0   . docusate sodium (COLACE) 100 MG capsule   Oral   Take 100 mg by mouth 2 (two) times daily.         . furosemide (LASIX) 20 MG tablet   Oral   Take 20 mg by mouth as needed.         . glyBURIDE (DIABETA) 1.25 MG tablet   Oral   Take 1.25 mg by mouth daily with breakfast.         . pantoprazole (PROTONIX) 40 MG tablet   Oral   Take 40 mg by mouth daily.         . Tamsulosin HCl (FLOMAX) 0.4 MG CAPS   Oral   Take 0.4 mg by mouth daily.          Marland Kitchen. testosterone (ANDROGEL) 50 MG/5GM GEL   Transdermal   Place 5 g onto the skin daily.         . polyethylene glycol (MIRALAX / GLYCOLAX) packet      Take 1 packet in 8 ounce glass of  water one to 2 times daily until results.  After that take 1 packet daily.   14 each   0    BP 155/84  Pulse 66  Temp(Src) 98.7 F (37.1 C)  Resp 16  Ht 5\' 9"  (1.753 m)  Wt 171 lb (77.565 kg)  BMI 25.24 kg/m2  SpO2 98% Physical Exam  Nursing note and vitals reviewed. Constitutional: He is oriented to person, place, and time. He appears well-developed and well-nourished. No distress.  HENT:  Head: Normocephalic and atraumatic.  Eyes: Pupils are equal, round, and reactive to light.  Neck: Normal range of motion.  Cardiovascular: Normal rate and intact distal pulses.   Pulmonary/Chest: No respiratory distress.  Abdominal: Normal appearance. He exhibits no distension. There is no tenderness. There is no rebound and no guarding.  Musculoskeletal: Normal range of motion.  Neurological: He is alert and oriented to person, place, and time. No cranial nerve deficit.  Skin: Skin is warm and dry. No rash noted.  Psychiatric: He has a normal mood and affect. His behavior is normal.    ED Course  Procedures (including critical care time)  Medications  mineral oil enema 1 enema (1 enema Rectal Given 08/06/13 1610)   Patient did have results from the enema. Labs Review Labs Reviewed - No data to  display Imaging Review No results found.    MDM   Final diagnoses:  Constipation        Nelia Shi, MD 08/06/13 [phone]

## 2015-11-03 ENCOUNTER — Emergency Department (HOSPITAL_BASED_OUTPATIENT_CLINIC_OR_DEPARTMENT_OTHER)
Admission: EM | Admit: 2015-11-03 | Discharge: 2015-11-03 | Disposition: A | Discharge status: Home / Self Care | Payer: Medicare Other | Attending: Emergency Medicine | Admitting: Emergency Medicine

## 2015-11-03 ENCOUNTER — Encounter (HOSPITAL_BASED_OUTPATIENT_CLINIC_OR_DEPARTMENT_OTHER): Payer: Self-pay | Admitting: Emergency Medicine

## 2015-11-03 DIAGNOSIS — Y9259 Other trade areas as the place of occurrence of the external cause: Secondary | ICD-10-CM | POA: Diagnosis not present

## 2015-11-03 DIAGNOSIS — Y9389 Activity, other specified: Secondary | ICD-10-CM | POA: Diagnosis not present

## 2015-11-03 DIAGNOSIS — I4891 Unspecified atrial fibrillation: Secondary | ICD-10-CM | POA: Diagnosis not present

## 2015-11-03 DIAGNOSIS — Z23 Encounter for immunization: Secondary | ICD-10-CM | POA: Insufficient documentation

## 2015-11-03 DIAGNOSIS — Y999 Unspecified external cause status: Secondary | ICD-10-CM | POA: Insufficient documentation

## 2015-11-03 DIAGNOSIS — W228XXA Striking against or struck by other objects, initial encounter: Secondary | ICD-10-CM | POA: Diagnosis not present

## 2015-11-03 DIAGNOSIS — E119 Type 2 diabetes mellitus without complications: Secondary | ICD-10-CM | POA: Insufficient documentation

## 2015-11-03 DIAGNOSIS — Z87891 Personal history of nicotine dependence: Secondary | ICD-10-CM | POA: Insufficient documentation

## 2015-11-03 DIAGNOSIS — K5900 Constipation, unspecified: Secondary | ICD-10-CM | POA: Diagnosis not present

## 2015-11-03 DIAGNOSIS — I10 Essential (primary) hypertension: Secondary | ICD-10-CM | POA: Diagnosis not present

## 2015-11-03 DIAGNOSIS — Z7984 Long term (current) use of oral hypoglycemic drugs: Secondary | ICD-10-CM | POA: Diagnosis not present

## 2015-11-03 DIAGNOSIS — S61411A Laceration without foreign body of right hand, initial encounter: Secondary | ICD-10-CM | POA: Insufficient documentation

## 2015-11-03 DIAGNOSIS — Z79899 Other long term (current) drug therapy: Secondary | ICD-10-CM | POA: Diagnosis not present

## 2015-11-03 DIAGNOSIS — E785 Hyperlipidemia, unspecified: Secondary | ICD-10-CM | POA: Insufficient documentation

## 2015-11-03 MED ORDER — TETANUS-DIPHTH-ACELL PERTUSSIS 5-2.5-18.5 LF-MCG/0.5 IM SUSP
0.5000 mL | Freq: Once | INTRAMUSCULAR | Status: AC
  Administered 2015-11-03: 0.5 mL via INTRAMUSCULAR
  Filled 2015-11-03: qty 0.5

## 2015-11-03 NOTE — ED Notes (Signed)
Pt c/o constipation. No BM since Thursday. Has taken ethylene glycol x 2 without relief. Denies abd pain, N/V. Also c/o lac to R hand on metal while working in his garage. Pt has been keeping it covered and cleaned and is concerned that he needs a tetanus shot. Redness and swelling noted to R hand.

## 2015-11-03 NOTE — ED Provider Notes (Signed)
CSN: [id number]     Arrival date & time 11/03/15  0747 History   First MD Initiated Contact with Patient 11/03/15 [phone]     Chief Complaint  Patient presents with  . Laceration  . Constipation     (Consider location/radiation/quality/duration/timing/severity/associated sxs/prior Treatment) HPI Patient feels constipated. No bowel movement for the past 3 days. He denies abdominal pain denies nausea or vomiting denies appetite change. He is treated himself with MiraLAX, without relief. He also requests a tetanus shot for laceration which she suffered on the dorsum of his right hand 6 days ago when he was moving objects and describes and got hit by a metal object. Hand is not painful. No numbness. No other associated symptoms. Past Medical History  Diagnosis Date  . Diabetes mellitus   . Hypertension   . Acid reflux   . Erectile dysfunction   . Atrial fibrillation (HCC)   . Bursitis   . Edema   . Thoracic discitis   . Hyperlipidemia   . Diverticulosis   . BPH (benign prostatic hyperplasia)    Past Surgical History  Procedure Laterality Date  . Total knee arthroplasty      L  . Tonsillectomy    . Bunionectomy Right    Family History  Problem Relation Age of Onset  . Heart disease Mother    Social History  Substance Use Topics  . Smoking status: Former Games developer  . Smokeless tobacco: None  . Alcohol Use: Yes     Comment: Occasional    Review of Systems  Constitutional: Negative.   HENT: Negative.   Respiratory: Negative.   Cardiovascular: Negative.   Gastrointestinal: Positive for constipation.  Musculoskeletal: Negative.   Skin: Positive for wound.       Laceration right hand. Surgical wound right foot from recent bunionectomy  Allergic/Immunologic: Positive for immunocompromised state.       Diabetic  Neurological: Negative.   Psychiatric/Behavioral: Negative.   All other systems reviewed and are negative.     Allergies  Ibuprofen and Keflex  Home  Medications   Prior to Admission medications   Medication Sig Start Date End Date Taking? Authorizing Provider  diltiazem (DILACOR XR) 120 MG 24 hr capsule Take 120 mg by mouth daily.   Yes Historical Provider, MD  furosemide (LASIX) 20 MG tablet Take 20 mg by mouth as needed. 02/03/13  Yes Lewayne Bunting, MD  glyBURIDE (DIABETA) 1.25 MG tablet Take 1.25 mg by mouth daily with breakfast.   Yes Historical Provider, MD  pantoprazole (PROTONIX) 40 MG tablet Take 40 mg by mouth daily.   Yes Historical Provider, MD  polyethylene glycol (MIRALAX / GLYCOLAX) packet Take 1 packet in 8 ounce glass of water one to 2 times daily until results.  After that take 1 packet daily. 08/06/13  Yes Nelva Nay, MD  albuterol (PROVENTIL HFA;VENTOLIN HFA) 108 (90 BASE) MCG/ACT inhaler Inhale 2 puffs into the lungs every 6 (six) hours as needed for wheezing or shortness of breath.    Historical Provider, MD  bisacodyl (DULCOLAX) 5 MG EC tablet Take 5 mg by mouth daily as needed for moderate constipation.    Historical Provider, MD  diphenhydrAMINE (BENADRYL) 25 MG tablet Take 1 tablet (25 mg total) by mouth every 6 (six) hours. 11/07/11 08/06/13  Susy Frizzle, MD  docusate sodium (COLACE) 100 MG capsule Take 100 mg by mouth 2 (two) times daily.    Historical Provider, MD  Tamsulosin HCl (FLOMAX) 0.4 MG CAPS Take 0.4 mg by  mouth daily.     Historical Provider, MD  testosterone (ANDROGEL) 50 MG/5GM GEL Place 5 g onto the skin daily.    Historical Provider, MD   BP 162/94 mmHg  Pulse 75  Temp(Src) 98.1 F (36.7 C) (Oral)  Resp 18  Ht 5\' 8"  (1.727 m)  Wt 181 lb (82.101 kg)  BMI 27.53 kg/m2  SpO2 99% Physical Exam  Constitutional: He appears well-developed and well-nourished. No distress.  HENT:  Head: Normocephalic and atraumatic.  Eyes: Conjunctivae are normal. Pupils are equal, round, and reactive to light.  Neck: Neck supple. No tracheal deviation present. No thyromegaly present.  Cardiovascular: Normal rate  and regular rhythm.   No murmur heard. Pulmonary/Chest: Effort normal and breath sounds normal.  Abdominal: Soft. Bowel sounds are normal. He exhibits no distension. There is no tenderness.  Genitourinary: Rectum normal and penis normal.  Brown stool no gross blood normal tone nontender  Musculoskeletal: Normal range of motion. He exhibits no edema or tenderness.  Neurological: He is alert. Coordination normal.  Skin: Skin is warm and dry. No rash noted.  Well-healing 2 cm laceration dorsum of right hand. Well healing surgical wound great toe of right foot  Psychiatric: He has a normal mood and affect.  Nursing note and vitals reviewed.   ED Course  Procedures (including critical care time) Labs Review Labs Reviewed - No data to display  Imaging Review No results found. I have personally reviewed and evaluated these images and lab results as part of my medical decision-making.   EKG Interpretation None     Patient received a soapsuds enema with relief and with good bowel movement. Tdap Administered MDM  No signs of infection of hand wound. Hand wound does not require repair or surgical wound of right foot. Plan local wound care Blood pressure recheck 3 weeks Diagnoses #1 constipation #2 laceration of right hand #3 well-healing surgical wound of right foot #4 elevated blood pressure Final diagnoses:  None        Doug SouSam Rakeem Colley, MD 11/03/15 [phone]

## 2015-11-03 NOTE — ED Notes (Signed)
Pt received of soap suds enema. Pt tolerated well. Pt assisted up to bedside commode.

## 2015-11-03 NOTE — ED Notes (Signed)
MD at bedside. 

## 2015-11-03 NOTE — Discharge Instructions (Signed)
Constipation, Adult Wash the wound on your right hand daily with soap and water and place a thin layer of bacitracin ointment over the wound and cover with a sterile bandage. Signs of infection include redness around the wound, pain, drainage from the wound or fever. Return or see your doctor if concerned for any reason. Get your blood pressure rechecked within the next 3 weeks. Today's was mildly elevated at 157/89. Constipation is when a person:  Poops (has a bowel movement) less than 3 times a week.  Has a hard time pooping.  Has poop that is dry, hard, or bigger than normal. HOME CARE   Eat foods with a lot of fiber in them. This includes fruits, vegetables, beans, and whole grains such as brown rice.  Avoid fatty foods and foods with a lot of sugar. This includes french fries, hamburgers, cookies, candy, and soda.  If you are not getting enough fiber from food, take products with added fiber in them (supplements).  Drink enough fluid to keep your pee (urine) clear or pale yellow.  Exercise on a regular basis, or as told by your doctor.  Go to the restroom when you feel like you need to poop. Do not hold it.  Only take medicine as told by your doctor. Do not take medicines that help you poop (laxatives) without talking to your doctor first. GET HELP RIGHT AWAY IF:   You have bright red blood in your poop (stool).  Your constipation lasts more than 4 days or gets worse.  You have belly (abdominal) or butt (rectal) pain.  You have thin poop (as thin as a pencil).  You lose weight, and it cannot be explained. MAKE SURE YOU:   Understand these instructions.  Will watch your condition.  Will get help right away if you are not doing well or get worse.   This information is not intended to replace advice given to you by your health care provider. Make sure you discuss any questions you have with your health care provider.   Document Released: 11/11/2007 Document Revised:  06/15/2014 Document Reviewed: 03/06/2013 Elsevier Interactive Patient Education [employer]2016 [employer] Inc.

## 2016-10-06 ENCOUNTER — Encounter (HOSPITAL_BASED_OUTPATIENT_CLINIC_OR_DEPARTMENT_OTHER): Payer: Self-pay | Admitting: *Deleted

## 2016-10-06 ENCOUNTER — Emergency Department (HOSPITAL_BASED_OUTPATIENT_CLINIC_OR_DEPARTMENT_OTHER)
Admission: EM | Admit: 2016-10-06 | Discharge: 2016-10-06 | Disposition: A | Discharge status: Home / Self Care | Payer: Medicare Other | Attending: Emergency Medicine | Admitting: Emergency Medicine

## 2016-10-06 ENCOUNTER — Emergency Department (HOSPITAL_BASED_OUTPATIENT_CLINIC_OR_DEPARTMENT_OTHER): Payer: Medicare Other

## 2016-10-06 DIAGNOSIS — S0990XA Unspecified injury of head, initial encounter: Secondary | ICD-10-CM

## 2016-10-06 DIAGNOSIS — S01112A Laceration without foreign body of left eyelid and periocular area, initial encounter: Secondary | ICD-10-CM | POA: Insufficient documentation

## 2016-10-06 DIAGNOSIS — S86912A Strain of unspecified muscle(s) and tendon(s) at lower leg level, left leg, initial encounter: Secondary | ICD-10-CM

## 2016-10-06 DIAGNOSIS — Z87891 Personal history of nicotine dependence: Secondary | ICD-10-CM | POA: Diagnosis not present

## 2016-10-06 DIAGNOSIS — W01198A Fall on same level from slipping, tripping and stumbling with subsequent striking against other object, initial encounter: Secondary | ICD-10-CM | POA: Insufficient documentation

## 2016-10-06 DIAGNOSIS — E119 Type 2 diabetes mellitus without complications: Secondary | ICD-10-CM | POA: Diagnosis not present

## 2016-10-06 DIAGNOSIS — K5900 Constipation, unspecified: Secondary | ICD-10-CM | POA: Insufficient documentation

## 2016-10-06 DIAGNOSIS — Y999 Unspecified external cause status: Secondary | ICD-10-CM | POA: Diagnosis not present

## 2016-10-06 DIAGNOSIS — Y929 Unspecified place or not applicable: Secondary | ICD-10-CM | POA: Insufficient documentation

## 2016-10-06 DIAGNOSIS — I10 Essential (primary) hypertension: Secondary | ICD-10-CM | POA: Insufficient documentation

## 2016-10-06 DIAGNOSIS — Y939 Activity, unspecified: Secondary | ICD-10-CM | POA: Insufficient documentation

## 2016-10-06 DIAGNOSIS — S0181XA Laceration without foreign body of other part of head, initial encounter: Secondary | ICD-10-CM

## 2016-10-06 DIAGNOSIS — S51012A Laceration without foreign body of left elbow, initial encounter: Secondary | ICD-10-CM

## 2016-10-06 LAB — CBC
HCT: 42.4 % (ref 39.0–52.0)
Hemoglobin: 14.3 g/dL (ref 13.0–17.0)
MCH: 33.2 pg (ref 26.0–34.0)
MCHC: 33.7 g/dL (ref 30.0–36.0)
MCV: 98.4 fL (ref 78.0–100.0)
Platelets: 167 10*3/uL (ref 150–400)
RBC: 4.31 MIL/uL (ref 4.22–5.81)
RDW: 12.9 % (ref 11.5–15.5)
WBC: 6.4 10*3/uL (ref 4.0–10.5)

## 2016-10-06 LAB — BASIC METABOLIC PANEL
ANION GAP: 8 (ref 5–15)
BUN: 32 mg/dL — ABNORMAL HIGH (ref 6–20)
CHLORIDE: 105 mmol/L (ref 101–111)
CO2: 25 mmol/L (ref 22–32)
Calcium: 8.6 mg/dL — ABNORMAL LOW (ref 8.9–10.3)
Creatinine, Ser: 1.61 mg/dL — ABNORMAL HIGH (ref 0.61–1.24)
GFR calc Af Amer: 41 mL/min — ABNORMAL LOW (ref >60)
GFR calc non Af Amer: 35 mL/min — ABNORMAL LOW (ref >60)
GLUCOSE: 202 mg/dL — AB (ref 65–99)
Potassium: 3.9 mmol/L (ref 3.5–5.1)
Sodium: 138 mmol/L (ref 135–145)

## 2016-10-06 MED ORDER — MAGNESIUM CITRATE PO SOLN: 1.0000 | Freq: Once | ORAL | 1 refills | Status: AC

## 2016-10-06 MED ORDER — ACETAMINOPHEN 500 MG PO TABS
1000.0000 mg | ORAL_TABLET | Freq: Once | ORAL | Status: AC
  Administered 2016-10-06: 1000 mg via ORAL
  Filled 2016-10-06: qty 2

## 2016-10-06 MED ORDER — MAGNESIUM CITRATE PO SOLN
ORAL | Status: AC
  Filled 2016-10-06: qty 296

## 2016-10-06 MED ORDER — MAGNESIUM CITRATE PO SOLN
1.0000 | Freq: Once | ORAL | Status: AC
  Administered 2016-10-06: 1 via ORAL

## 2016-10-06 NOTE — Discharge Instructions (Addendum)
You expressed concerns for constipation that has been increasing over a number of months. Drink a half a bottle of magnesium citrate, if you have had no bowel movement within 4 hours, drink the second half.  If you still have not had a bowel movement within a 12 hour period of your second dose, you may drink a full bottle the following day.   Keep wounds clean, watch for signs of infection.   If you were given medicines take as directed.  If you are on coumadin or contraceptives realize their levels and effectiveness is altered by many different medicines.  If you have any reaction (rash, tongues swelling, other) to the medicines stop taking and see a physician.    If your blood pressure was elevated in the ER make sure you follow up for management with a primary doctor or return for chest pain, shortness of breath or stroke symptoms.  Please follow up as directed and return to the ER or see a physician for new or worsening symptoms.  Thank you. Vitals:   10/06/16 1427  BP: 138/69  Pulse: (!) 57  Resp: 18  Temp: 98.1 F (36.7 C)  TempSrc: Oral  SpO2: 95%  Weight: 180 lb (81.6 kg)  Height:  (1.753 m)

## 2016-10-06 NOTE — ED Notes (Signed)
Notified RIverLAnding for pt pick up

## 2016-10-06 NOTE — ED Provider Notes (Addendum)
[facility]-EMERGENCY DEPT Provider Note   CSN: [id number] Arrival date & time: 10/06/16  1427     History   Chief Complaint Chief Complaint  Patient presents with  . Fall    HPI Gary [name] is a 83 y.o. male.  Patient from [facility] assisted living facility presents after fall. Patient recalls catching his foot on the grass leaving him to fall and hit his left knee and left for head. Patient has superficial lacerations to left elbow and left for head. Patient recalls events no syncope. Patient feels at baseline. Patient has had intermittent issues with constipation and states only thing that helps is an enema. Patient denies blood thinner use.      Past Medical History:  Diagnosis Date  . Acid reflux   . Anemia   . Atrial fibrillation (HCC)   . BPH (benign prostatic hyperplasia)   . Bursitis   . Constipation   . Diabetes mellitus   . Diverticulosis   . Edema   . Erectile dysfunction   . Hyperlipidemia   . Hypertension   . Renal disorder   . Thoracic discitis     Patient Active Problem List   Diagnosis Date Noted  . Hypertension   . Acid reflux   . Erectile dysfunction   . Atrial fibrillation (HCC)   . Edema   . Thoracic discitis     Past Surgical History:  Procedure Laterality Date  . BUNIONECTOMY Right   . TONSILLECTOMY    . TOTAL KNEE ARTHROPLASTY     L       Home Medications    Prior to Admission medications   Medication Sig Start Date End Date Taking? Authorizing Provider  albuterol (PROVENTIL HFA;VENTOLIN HFA) 108 (90 BASE) MCG/ACT inhaler Inhale 2 puffs into the lungs every 6 (six) hours as needed for wheezing or shortness of breath.    [provider]  ATORVASTATIN CALCIUM PO Take by mouth.    [provider]  bisacodyl (DULCOLAX) 5 MG EC tablet Take 5 mg by mouth daily as needed for moderate constipation.    [provider]  cholecalciferol (VITAMIN D) 1000 units tablet Take 1,000 Units by mouth daily.     [provider]  diltiazem (DILACOR XR) 120 MG 24 hr capsule Take 120 mg by mouth daily.    [provider]  diphenhydrAMINE (BENADRYL) 25 MG tablet Take 1 tablet (25 mg total) by mouth every 6 (six) hours. 11/07/11 08/06/13  Susy Frizzle, MD  docusate sodium (COLACE) 100 MG capsule Take 100 mg by mouth 2 (two) times daily.    [provider]  furosemide (LASIX) 20 MG tablet Take 20 mg by mouth as needed. 02/03/13   Lewayne Bunting, MD  glyBURIDE (DIABETA) 1.25 MG tablet Take 1.25 mg by mouth daily with breakfast.    [provider]  pantoprazole (PROTONIX) 40 MG tablet Take 40 mg by mouth daily.    [provider]  polyethylene glycol (MIRALAX / GLYCOLAX) packet Take 1 packet in 8 ounce glass of water one to 2 times daily until results.  After that take 1 packet daily. 08/06/13   Nelva Nay, MD  Tamsulosin HCl (FLOMAX) 0.4 MG CAPS Take 0.4 mg by mouth daily.     [provider]  testosterone (ANDROGEL) 50 MG/5GM GEL Place 5 g onto the skin daily.    [provider]    Family History Family History  Problem Relation Age of Onset  . Heart disease  Mother     Social History Social History  Substance Use Topics  . Smoking status: Former Games developer  . Smokeless tobacco: Never Used  . Alcohol use Yes     Comment: Occasional     Allergies   Ace inhibitors; Aspirin; Ibuprofen; Keflex [cephalexin]; Nsaids; and Rofecoxib   Review of Systems Review of Systems  Constitutional: Negative for chills and fever.  HENT: Negative for congestion.   Eyes: Negative for visual disturbance.  Respiratory: Negative for shortness of breath.   Cardiovascular: Negative for chest pain.  Gastrointestinal: Positive for constipation. Negative for abdominal pain and vomiting.  Genitourinary: Negative for dysuria and flank pain.  Musculoskeletal: Positive for arthralgias. Negative for back pain, neck pain and neck stiffness.  Skin: Positive for  wound. Negative for rash.  Neurological: Negative for light-headedness and headaches.     Physical Exam Updated Vital Signs BP (!) 151/78 (BP Location: Right Arm)   Pulse (!) 52   Temp 98.1 F (36.7 C) (Oral)   Resp 18   Ht  (1.753 m)   Wt 180 lb (81.6 kg)   SpO2 95%   BMI 26.58 kg/m   Physical Exam  Constitutional: He is oriented to person, place, and time. He appears well-developed and well-nourished.  HENT:  Head: Normocephalic.  1.5 cm superficial laceration left lateral eyebrow no active bleeding. No gaping.  Eyes: Conjunctivae are normal. Right eye exhibits no discharge. Left eye exhibits no discharge.  Neck: Normal range of motion. Neck supple. No tracheal deviation present.  Cardiovascular: Normal rate and regular rhythm.   Pulmonary/Chest: Effort normal and breath sounds normal.  Abdominal: Soft. He exhibits no distension. There is no tenderness. There is no guarding.  Musculoskeletal: He exhibits no edema.  Patient is mild tenderness left patella with superficial abrasions. Patient has flexion extension without significant discomfort or effusion. Patient has no tenderness to range of motion of bilateral hips or right knee. Patient is mild tenderness to lateral elbow with superficial skin tear. Patient has mild ecchymosis to dorsum of left hand without bony tenderness in the wrist or hand. No midline cervical thoracic or lumbar tenderness. Patient has mild tenderness to left lateral lower ribs.  Neurological: He is alert and oriented to person, place, and time. He has normal strength. No cranial nerve deficit. GCS eye subscore is 4. GCS verbal subscore is 5. GCS motor subscore is 6.  Skin: Skin is warm.  Psychiatric: He has a normal mood and affect.  Nursing note and vitals reviewed.    ED Treatments / Results  Labs (all labs ordered are listed, but only abnormal results are displayed) Labs Reviewed  BASIC METABOLIC PANEL - Abnormal; Notable for the following:        Result Value   Glucose, Bld 202 (*)    BUN 32 (*)    Creatinine, Ser 1.61 (*)    Calcium 8.6 (*)    GFR calc non Af Amer 35 (*)    GFR calc Af Amer 41 (*)    All other components within normal limits  CBC    EKG  EKG Interpretation None       Radiology No results found.  Procedures Procedures (including critical care time)  Medications Ordered in ED Medications  acetaminophen (TYLENOL) tablet 1,000 mg (1,000 mg Oral Given 10/06/16 1529)  magnesium citrate solution 1 Bottle (1 Bottle Oral Given 10/06/16 1636)     Initial Impression / Assessment and Plan / ED Course  I have reviewed the  triage vital signs and the nursing notes.  Pertinent labs & imaging results that were available during my care of the patient were reviewed by me and considered in my medical decision making (see chart for details).    Well-appearing elderly male presents after reportedly mechanical fall. Patient feels at baseline. Vitals unremarkable. No recent blood work in the system. Plan for screening labs check for anemia and any other reason that would make more likely to fall. Patient okay with CT head fortunately does not have any signs of intracranial bleeding however he is not sure if he is on blood thinner. Plan for x-rays.  Patient refuses x-ray the left knee as says it does not hurt. Patient okay without sutures and plan for Dermabond and Steri-Strips.  Patient's care be signed out to follow-up imaging and reassess prior to discharge.  Final Clinical Impressions(s) / ED Diagnoses   Final diagnoses:  Acute head injury, initial encounter  Skin tear of left elbow without complication, initial encounter  Facial laceration, initial encounter  Knee strain, left, initial encounter  Constipation, unspecified constipation type    New Prescriptions Discharge Medication List as of 10/06/2016  4:16 PM    START taking these medications   Details  magnesium citrate SOLN Take 296 mLs (1 Bottle  total) by mouth once., Starting Tue 10/06/2016, Print         Blane Ohara, MD 10/06/16 1513    Blane Ohara, MD 10/26/16 [phone]

## 2016-10-06 NOTE — ED Notes (Signed)
Patient transported to CT 

## 2016-10-06 NOTE — ED Provider Notes (Signed)
Patient accepted from Dr. Jodi Mourning  at signout with plan to review CTs and x-rays. If negative plan was for discharge. Plan also for applying Mepitel to minor skin tear. At discharge patient is alert and appropriate. Wounds are examined. They will be cleaned and dressed. Patient incidentally reports that he has had constipation for months and had slowing of bowel movements. He reports he has discussed this with his providers of [facility] and in the suggested continuing MiraLAX. At this time, patient will be instructed on use of magnesium citrate for 1-2 doses to facilitate bowel movement. Patient reports last bowel movement was 3 days ago, he has no associated pain or other symptoms.   Arby Barrette, MD 10/06/16 [phone]

## 2016-10-06 NOTE — ED Notes (Signed)
Pt from [facility].  States that he was walking today and tripped on a curb.  Pt brought in by EMS.  Laceration about left eye-bleeding controlled, denies change in vision, denies LOC.  Skin tear to left elbow-bleeding controlled.  Abrasion to left knee.    Pt also reports that he would like some assistance with his constipation.  Hx of same.  States that he has taken 2 doses of miralax without relief.

## 2016-10-06 NOTE — ED Triage Notes (Signed)
Per EMS: Fall when stepping off a curb today.  Left shoulder, knee pain.  Laceration above left eye, bleeding controlled.  Ambulatory.

## 2016-10-06 NOTE — ED Notes (Addendum)
Pt states last tetanus vaccine was in the last 5 years.   TdaP: 11/03/2015

## 2016-10-07 ENCOUNTER — Emergency Department (HOSPITAL_BASED_OUTPATIENT_CLINIC_OR_DEPARTMENT_OTHER)
Admission: EM | Admit: 2016-10-07 | Discharge: 2016-10-07 | Disposition: A | Discharge status: Home / Self Care | Payer: Medicare Other | Attending: Emergency Medicine | Admitting: Emergency Medicine

## 2016-10-07 ENCOUNTER — Encounter (HOSPITAL_BASED_OUTPATIENT_CLINIC_OR_DEPARTMENT_OTHER): Payer: Self-pay | Admitting: *Deleted

## 2016-10-07 DIAGNOSIS — E119 Type 2 diabetes mellitus without complications: Secondary | ICD-10-CM | POA: Diagnosis not present

## 2016-10-07 DIAGNOSIS — I10 Essential (primary) hypertension: Secondary | ICD-10-CM | POA: Insufficient documentation

## 2016-10-07 DIAGNOSIS — K5641 Fecal impaction: Secondary | ICD-10-CM

## 2016-10-07 DIAGNOSIS — Z79899 Other long term (current) drug therapy: Secondary | ICD-10-CM | POA: Insufficient documentation

## 2016-10-07 DIAGNOSIS — Z7984 Long term (current) use of oral hypoglycemic drugs: Secondary | ICD-10-CM | POA: Diagnosis not present

## 2016-10-07 DIAGNOSIS — K5900 Constipation, unspecified: Secondary | ICD-10-CM | POA: Diagnosis present

## 2016-10-07 HISTORY — DX: Constipation, unspecified: K59.00

## 2016-10-07 MED ORDER — FLEET ENEMA 7-19 GM/118ML RE ENEM
1.0000 | ENEMA | Freq: Once | RECTAL | Status: AC
  Administered 2016-10-07: 1 via RECTAL
  Filled 2016-10-07: qty 1

## 2016-10-07 MED ORDER — MAGNESIUM CITRATE PO SOLN: 1.0000 | Freq: Once | ORAL | Status: DC

## 2016-10-07 MED ORDER — POLYETHYLENE GLYCOL 3350 17 G PO PACK
68.0000 g | PACK | Freq: Every day | ORAL | Status: DC
  Filled 2016-10-07: qty 4

## 2016-10-07 NOTE — ED Notes (Signed)
Pt sts "Whenever I get this problem I get it fixed here.  They don't do enemas where I live."

## 2016-10-07 NOTE — ED Notes (Signed)
Pt had huge BM

## 2016-10-07 NOTE — ED Provider Notes (Addendum)
[facility]   CSN: [id number] Arrival date & time: 10/07/16  1804  By signing my name below, I, Linna Darner, attest that this documentation has been prepared under the direction and in the presence of physician practitioner, Melene Plan, DO. Electronically Signed: Linna Darner, Scribe. 10/07/2016. 6:28 PM.  History   Chief Complaint Chief Complaint  Patient presents with  . Constipation   The history is provided by the patient. No language interpreter was used.  Constipation   This is a recurrent problem. The current episode started more than 1 week ago. Pertinent negatives include no abdominal pain. There has been adequate water intake. He has tried stimulants for the symptoms. The treatment provided no relief. His past medical history does not include irritable bowel syndrome.  Illness  This is a chronic problem. The current episode started 2 days ago. The problem occurs constantly. The problem has not changed since onset.Pertinent negatives include no abdominal pain. Nothing aggravates the symptoms. Nothing relieves the symptoms. He has tried nothing for the symptoms. The treatment provided no relief.    HPI Comments: Gary [name] is a 83 y.o. male with PMHx including constipation, DM, HTN, and HLD who presents to the Emergency Department complaining of intermittent constipation for several months, worse over the last several days. Patient reports some associated suprapubic pain. He was evaluated here yesterday after a fall and informed hospital staff of his prolonged constipation. He has been taking MiraLax at home without significant improvement of his constipation, and was prescribed magnesium citrate here yesterday which has also not provided any relief. Pt denies fevers, chills, nausea, vomiting, or any other associated symptoms.  Past Medical History:  Diagnosis Date  . Acid reflux   . Atrial fibrillation (HCC)   . BPH (benign prostatic hyperplasia)     . Bursitis   . Constipation   . Diabetes mellitus   . Diverticulosis   . Edema   . Erectile dysfunction   . Hyperlipidemia   . Hypertension   . Thoracic discitis     Patient Active Problem List   Diagnosis Date Noted  . Hypertension   . Acid reflux   . Erectile dysfunction   . Atrial fibrillation (HCC)   . Edema   . Thoracic discitis     Past Surgical History:  Procedure Laterality Date  . BUNIONECTOMY Right   . TONSILLECTOMY    . TOTAL KNEE ARTHROPLASTY     L       Home Medications    Prior to Admission medications   Medication Sig Start Date End Date Taking? Authorizing Provider  albuterol (PROVENTIL HFA;VENTOLIN HFA) 108 (90 BASE) MCG/ACT inhaler Inhale 2 puffs into the lungs every 6 (six) hours as needed for wheezing or shortness of breath.    Historical Provider, MD  bisacodyl (DULCOLAX) 5 MG EC tablet Take 5 mg by mouth daily as needed for moderate constipation.    Historical Provider, MD  diltiazem (DILACOR XR) 120 MG 24 hr capsule Take 120 mg by mouth daily.    Historical Provider, MD  diphenhydrAMINE (BENADRYL) 25 MG tablet Take 1 tablet (25 mg total) by mouth every 6 (six) hours. 11/07/11 08/06/13  Susy Frizzle, MD  docusate sodium (COLACE) 100 MG capsule Take 100 mg by mouth 2 (two) times daily.    Historical Provider, MD  furosemide (LASIX) 20 MG tablet Take 20 mg by mouth as needed. 02/03/13   Lewayne Bunting, MD  glyBURIDE (DIABETA) 1.25 MG tablet Take  1.25 mg by mouth daily with breakfast.    Historical Provider, MD  pantoprazole (PROTONIX) 40 MG tablet Take 40 mg by mouth daily.    Historical Provider, MD  polyethylene glycol (MIRALAX / GLYCOLAX) packet Take 1 packet in 8 ounce glass of water one to 2 times daily until results.  After that take 1 packet daily. 08/06/13   Nelva Nay, MD  Tamsulosin HCl (FLOMAX) 0.4 MG CAPS Take 0.4 mg by mouth daily.     Historical Provider, MD  testosterone (ANDROGEL) 50 MG/5GM GEL Place 5 g onto the skin daily.     Historical Provider, MD    Family History Family History  Problem Relation Age of Onset  . Heart disease Mother     Social History Social History  Substance Use Topics  . Smoking status: Former Games developer  . Smokeless tobacco: Never Used  . Alcohol use Yes     Comment: Occasional     Allergies   Ibuprofen and Keflex [cephalexin]   Review of Systems Review of Systems  Constitutional: Negative for chills and fever.  Gastrointestinal: Positive for constipation. Negative for abdominal pain, nausea and vomiting.  All other systems reviewed and are negative.  Physical Exam Updated Vital Signs BP (!) 169/100 (BP Location: Right Arm)   Pulse 82   Temp 97.5 F (36.4 C)   Resp 16   Ht  (1.753 m)   Wt 180 lb (81.6 kg)   SpO2 96%   BMI 26.58 kg/m   Physical Exam  Constitutional: He is oriented to person, place, and time. He appears well-developed and well-nourished.  HENT:  Head: Normocephalic and atraumatic.  Eyes: EOM are normal. Pupils are equal, round, and reactive to light.  Neck: Normal range of motion. Neck supple. No JVD present.  Cardiovascular: Normal rate and regular rhythm.  Exam reveals no gallop and no friction rub.   No murmur heard. Pulmonary/Chest: No respiratory distress. He has no wheezes.  Abdominal: He exhibits no distension. There is no tenderness. There is no rebound and no guarding.  Genitourinary:  Genitourinary Comments: Rectal exam: Hard stool in the vault.  Musculoskeletal: Normal range of motion.  Neurological: He is alert and oriented to person, place, and time.  Skin: No rash noted. No pallor.  Psychiatric: He has a normal mood and affect. His behavior is normal.  Nursing note and vitals reviewed.  ED Treatments / Results  Labs (all labs ordered are listed, but only abnormal results are displayed) Labs Reviewed - No data to display  EKG  EKG Interpretation None       Radiology Dg Chest 2 View  Result Date:  10/06/2016 CLINICAL DATA:  Fall with head injury Initial encounter. EXAM: CHEST  2 VIEW COMPARISON:  01/31/2013 FINDINGS: Normal heart size. Stable aortic contours and upper mediastinal widening. There is no edema, consolidation, effusion, or pneumothorax. No evidence of fracture. IMPRESSION: No acute finding.  Stable from prior. Electronically Signed   By: Marnee Spring M.D.   On: 10/06/2016 15:50   Dg Elbow Complete Left  Result Date: 10/06/2016 CLINICAL DATA:  Tripped and fell today on a curb. Laceration to left posterolateral elbow. EXAM: LEFT ELBOW - COMPLETE 3+ VIEW COMPARISON:  None. FINDINGS: There is no evidence of fracture, dislocation, or joint effusion. There is no evidence of arthropathy or other focal bone abnormality. Soft tissues are unremarkable. IMPRESSION: Negative. Electronically Signed   By: Kennith Center M.D.   On: 10/06/2016 15:50   Ct Head Wo  Contrast  Result Date: 10/06/2016 CLINICAL DATA:  Pain following fall EXAM: CT HEAD WITHOUT CONTRAST TECHNIQUE: Contiguous axial images were obtained from the base of the skull through the vertex without intravenous contrast. COMPARISON:  March 08, 2009 FINDINGS: Brain: There is mild diffuse atrophy. There is no intracranial mass, hemorrhage, extra-axial fluid collection, or midline shift. There is slight small vessel disease in the centra semiovale bilaterally. Elsewhere gray-white compartments are normal. There is no evident acute infarct. Vascular: There is no hyperdense vessel. There is calcification in each carotid siphon region. There is calcification in each distal vertebral artery. Basilar artery is tortuous. Skull: There is no evident fracture. There is a benign exostosis along the left parietal bone, stable. There is bony hypertrophy anterior to the left frontal sinus with benign-appearing bony expansion. Central lucency is noted in this area, unchanged from prior study. Sinuses/Orbits: There is mucosal thickening in several ethmoid air  cells. Other visualized paranasal sinuses are clear. Orbits appear symmetric bilaterally. Other: Mastoid air cells are clear. There is debris in each external auditory canal. IMPRESSION: Atrophy with mild periventricular small vessel disease, stable. No intracranial mass, hemorrhage, or extra-axial fluid collection. No evident acute infarct. Foci of arteriovascular calcification noted bilaterally. Benign left parietal exostosis. Benign expansion of bone anterior to the left frontal sinus. 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 Stability of this area since 2010 is consistent with benign etiology. Areas of ethmoid sinus disease bilaterally. Probable cerumen noted in each external auditory canal. Electronically Signed   By: Bretta Bang III M.D.   On: 10/06/2016 15:49    Procedures Fecal disimpaction Date/Time: 10/07/2016 7:47 PM Performed by: Adela Lank Bessie Boyte Authorized by: Melene Plan  Consent: Verbal consent obtained. Risks and benefits: risks, benefits and alternatives were discussed Consent given by: patient Patient identity confirmed: verbally with patient Time out: Immediately prior to procedure a "time out" was called to verify the correct patient, procedure, equipment, support staff and site/side marked as required. Local anesthesia used: no  Anesthesia: Local anesthesia used: no  Sedation: Patient sedated: no Patient tolerance: Patient tolerated the procedure well with no immediate complications    (including critical care time)  DIAGNOSTIC STUDIES: Oxygen Saturation is 98% on RA, normal by my interpretation.    COORDINATION OF CARE: 6:35 PM Discussed treatment plan with pt at bedside and pt agreed to plan.  Medications Ordered in ED Medications  polyethylene glycol (MIRALAX / GLYCOLAX) packet 68 g (68 g Oral Not Given 10/07/16 1858)  sodium phosphate (FLEET) 7-19 GM/118ML enema 1 enema (1 enema Rectal Given 10/07/16 1901)     Initial Impression / Assessment and Plan / ED Course   I have reviewed the triage vital signs and the nursing notes.  Pertinent labs & imaging results that were available during my care of the patient were reviewed by me and considered in my medical decision making (see chart for details).     83 yo M With a chief complaint of constipation. This been a recurrent issue for this patient. He thinks due to having part of his rectum removed he has a smaller sphincter than normal. Has tried some home therapy without improvement. States sometimes this happens he needs to come for a enema. He lives at a assisted living facility and they will not perform this for him at home. Patient has no abdominal pain on exam. Has a fecal impaction. Was given an enema with significant improvement of his symptoms. And he is to have no abdominal tenderness. Tolerating by  mouth. Discharge home.  7:48 PM: I have discussed the diagnosis/risks/treatment options with the patient and believe the pt to be eligible for discharge home to follow-up with PCP. We also discussed returning to the ED immediately if new or worsening sx occur. We discussed the sx which are most concerning (e.g., sudden worsening pain, fever, inability to tolerate by mouth) that necessitate immediate return. Medications administered to the patient during their visit and any new prescriptions provided to the patient are listed below.  Medications given during this visit Medications  polyethylene glycol (MIRALAX / GLYCOLAX) packet 68 g (68 g Oral Not Given 10/07/16 1858)  sodium phosphate (FLEET) 7-19 GM/118ML enema 1 enema (1 enema Rectal Given 10/07/16 1901)     The patient appears reasonably screen and/or stabilized for discharge and I doubt any other medical condition or other [facility] requiring further screening, evaluation, or treatment in the ED at this time prior to discharge.    Final Clinical Impressions(s) / ED Diagnoses   Final diagnoses:  Fecal impaction in rectum [facility])    New  Prescriptions Discharge Medication List as of 10/07/2016  7:30 PM     I personally performed the services described in this documentation, which was scribed in my presence. The recorded information has been reviewed and is accurate.     Melene Plan, DO 10/07/16 1948    Melene Plan, DO 10/07/16 1610

## 2016-10-07 NOTE — ED Triage Notes (Signed)
Pt c/o constipation , seen here yesterday for same, given mag citrate w/o results

## 2016-10-16 ENCOUNTER — Emergency Department (HOSPITAL_BASED_OUTPATIENT_CLINIC_OR_DEPARTMENT_OTHER): Payer: Medicare Other

## 2016-10-16 ENCOUNTER — Emergency Department (HOSPITAL_BASED_OUTPATIENT_CLINIC_OR_DEPARTMENT_OTHER)
Admission: EM | Admit: 2016-10-16 | Discharge: 2016-10-16 | Disposition: A | Discharge status: Home / Self Care | Payer: Medicare Other | Attending: Emergency Medicine | Admitting: Emergency Medicine

## 2016-10-16 ENCOUNTER — Encounter (HOSPITAL_BASED_OUTPATIENT_CLINIC_OR_DEPARTMENT_OTHER): Payer: Self-pay

## 2016-10-16 DIAGNOSIS — S0990XA Unspecified injury of head, initial encounter: Secondary | ICD-10-CM | POA: Diagnosis not present

## 2016-10-16 DIAGNOSIS — Y999 Unspecified external cause status: Secondary | ICD-10-CM | POA: Insufficient documentation

## 2016-10-16 DIAGNOSIS — Y9389 Activity, other specified: Secondary | ICD-10-CM | POA: Insufficient documentation

## 2016-10-16 DIAGNOSIS — Y92009 Unspecified place in unspecified non-institutional (private) residence as the place of occurrence of the external cause: Secondary | ICD-10-CM | POA: Diagnosis not present

## 2016-10-16 DIAGNOSIS — Z7984 Long term (current) use of oral hypoglycemic drugs: Secondary | ICD-10-CM | POA: Diagnosis not present

## 2016-10-16 DIAGNOSIS — Z79899 Other long term (current) drug therapy: Secondary | ICD-10-CM | POA: Insufficient documentation

## 2016-10-16 DIAGNOSIS — E119 Type 2 diabetes mellitus without complications: Secondary | ICD-10-CM | POA: Diagnosis not present

## 2016-10-16 DIAGNOSIS — I10 Essential (primary) hypertension: Secondary | ICD-10-CM | POA: Diagnosis not present

## 2016-10-16 DIAGNOSIS — W01198A Fall on same level from slipping, tripping and stumbling with subsequent striking against other object, initial encounter: Secondary | ICD-10-CM | POA: Diagnosis not present

## 2016-10-16 DIAGNOSIS — Z87891 Personal history of nicotine dependence: Secondary | ICD-10-CM | POA: Insufficient documentation

## 2016-10-16 DIAGNOSIS — W19XXXA Unspecified fall, initial encounter: Secondary | ICD-10-CM

## 2016-10-16 HISTORY — DX: Anemia, unspecified: D64.9

## 2016-10-16 HISTORY — DX: Disorder of kidney and ureter, unspecified: N28.9

## 2016-10-16 NOTE — ED Notes (Signed)
Patient transported to X-ray 

## 2016-10-16 NOTE — ED Provider Notes (Signed)
[facility]   CSN: [id number] Arrival date & time: 10/16/16  1735  By signing my name below, I, Gary [name], attest that this documentation has been prepared under the direction and in the presence of Cardama, Amadeo GarnetPedro Eduardo, MD. Electronically Signed: Rosana Fretana [name], ED Scribe. 10/16/16. 7:08 PM.   History   Chief Complaint Chief Complaint  Patient presents with  . Fall    The history is provided by the patient. No language interpreter was used.   HPI Comments: Gary [name] is a 83 y.o. male with a PMHx of AFib, HTN, DM, who presents to the Emergency Department following a ground-level, mechanical fall which occured 4 hours ago. Pt reports that he was backing into a chair and lost his footing, causing him to fall backwards and strike his head on the hardwood flood below him. He did not lose consciousness. He denies any acute areas of pain or other areas of injury at present. He reports that he also fell earlier this week with associated left rib pain; he was evaluated in the ED following his fall with DG ribs which were unremarkable. Pt is not currently on anticoagulant or antiplatelet therapy. He denies global/unliateral weakness, or headache.    Past Medical History:  Diagnosis Date  . Acid reflux   . Anemia   . Atrial fibrillation (HCC)   . BPH (benign prostatic hyperplasia)   . Bursitis   . Constipation   . Diabetes mellitus   . Diverticulosis   . Edema   . Erectile dysfunction   . Hyperlipidemia   . Hypertension   . Renal disorder   . Thoracic discitis     Patient Active Problem List   Diagnosis Date Noted  . Hypertension   . Acid reflux   . Erectile dysfunction   . Atrial fibrillation (HCC)   . Edema   . Thoracic discitis     Past Surgical History:  Procedure Laterality Date  . BUNIONECTOMY Right   . TONSILLECTOMY    . TOTAL KNEE ARTHROPLASTY     L       Home Medications    Prior to Admission medications     Medication Sig Start Date End Date Taking? Authorizing Provider  ATORVASTATIN CALCIUM PO Take by mouth.   Yes [provider]  cholecalciferol (VITAMIN D) 1000 units tablet Take 1,000 Units by mouth daily.   Yes [provider]  diltiazem (DILACOR XR) 120 MG 24 hr capsule Take 120 mg by mouth daily.   Yes [provider]  docusate sodium (COLACE) 100 MG capsule Take 100 mg by mouth 2 (two) times daily.   Yes [provider]  furosemide (LASIX) 20 MG tablet Take 20 mg by mouth as needed. 02/03/13  Yes Lewayne Buntingrenshaw, Brian S, MD  glyBURIDE (DIABETA) 1.25 MG tablet Take 1.25 mg by mouth daily with breakfast.   Yes [provider]  albuterol (PROVENTIL HFA;VENTOLIN HFA) 108 (90 BASE) MCG/ACT inhaler Inhale 2 puffs into the lungs every 6 (six) hours as needed for wheezing or shortness of breath.    [provider]  bisacodyl (DULCOLAX) 5 MG EC tablet Take 5 mg by mouth daily as needed for moderate constipation.    [provider]  diphenhydrAMINE (BENADRYL) 25 MG tablet Take 1 tablet (25 mg total) by mouth every 6 (six) hours. 11/07/11 08/06/13  Susy FrizzleSheldon, Charles, MD  pantoprazole (PROTONIX) 40 MG tablet Take 40 mg by mouth daily.    [provider]  polyethylene glycol (MIRALAX / GLYCOLAX) packet Take 1 packet in 8 ounce glass of water one to 2 times daily until results.  After that take 1 packet daily. 08/06/13   Nelva Nay, MD  Tamsulosin HCl (FLOMAX) 0.4 MG CAPS Take 0.4 mg by mouth daily.     [provider]  testosterone (ANDROGEL) 50 MG/5GM GEL Place 5 g onto the skin daily.    [provider]    Family History Family History  Problem Relation Age of Onset  . Heart disease Mother     Social History Social History  Substance Use Topics  . Smoking status: Former Games developer  . Smokeless tobacco: Never Used  . Alcohol use Yes     Comment: Occasional     Allergies   Ace inhibitors; Aspirin; Ibuprofen; Keflex  [cephalexin]; Nsaids; and Rofecoxib   Review of Systems Review of Systems All other systems reviewed and are negative for acute change except as noted in the HPI.   Physical Exam Updated Vital Signs BP 136/71   Pulse (!) 57   Temp 97.8 F (36.6 C) (Oral)   Resp 18   Ht 5\' 9"  (1.753 m)   Wt 180 lb (81.6 kg)   SpO2 92%   BMI 26.58 kg/m   Physical Exam  Constitutional: He is oriented to person, place, and time. He appears well-developed and well-nourished. No distress.  HENT:  Head: Normocephalic.  Right Ear: External ear normal.  Left Ear: External ear normal.  Mouth/Throat: Oropharynx is clear and moist.  Eyes: Conjunctivae and EOM are normal. Pupils are equal, round, and reactive to light. Right eye exhibits no discharge. Left eye exhibits no discharge. No scleral icterus.  Neck: Normal range of motion. Neck supple.  Cardiovascular: Regular rhythm and normal heart sounds.  Exam reveals no gallop and no friction rub.   No murmur heard. Pulses:      Radial pulses are 2+ on the right side, and 2+ on the left side.       Dorsalis pedis pulses are 2+ on the right side, and 2+ on the left side.  Pulmonary/Chest: Effort normal and breath sounds normal. No stridor. No respiratory distress.  Abdominal: Soft. He exhibits no distension. There is no tenderness.  Musculoskeletal:       Cervical back: He exhibits no bony tenderness.       Thoracic back: He exhibits no bony tenderness.       Lumbar back: He exhibits no bony tenderness.  Clavicle stable. Chest stable to AP/Lat compression. Pelvis stable to Lat compression. No obvious extremity deformity. No chest or abdominal wall contusion.  Neurological: He is alert and oriented to person, place, and time. GCS eye subscore is 4. GCS verbal subscore is 5. GCS motor subscore is 6.  Moving all extremities   Skin: Skin is warm. He is not diaphoretic.     ED Treatments / Results  DIAGNOSTIC STUDIES: Oxygen Saturation is 92% on RA,  low by my interpretation.   COORDINATION OF CARE: 6:37 PM-Ordered CT Head and discussed next steps with pt. Pt verbalized understanding and is agreeable with the plan.   Labs (all labs ordered are listed, but only abnormal results are displayed) Labs Reviewed - No data to display  EKG  EKG Interpretation None      Radiology Ct Head Wo Contrast  Result Date: 10/16/2016 CLINICAL DATA:  Slip and fall today striking head.  Dizzy. EXAM: CT HEAD WITHOUT CONTRAST TECHNIQUE: Contiguous axial images were obtained from  the base of the skull through the vertex without intravenous contrast. COMPARISON:  Head CT 10 days prior 10/06/2016 FINDINGS: Brain: No intracranial hemorrhage. Stable atrophy and chronic small vessel ischemia. No subdural or extra-axial fluid collection. No midline shift or mass effect. Vascular: Atherosclerosis and tortuosity of skullbase vasculature. No hyperdense vessel. Skull: No fracture. Stable exostosis from left parietal bone, benign. Sinuses/Orbits: Trace mucosal thickening of the ethmoid air cells. No sinus fluid levels. Mastoid air cells are clear. The visualized orbits are unremarkable. Other: None IMPRESSION: No acute intracranial abnormality. Stable atrophy and chronic small vessel ischemia. Electronically Signed   By: Rubye Oaks M.D.   On: 10/16/2016 19:14    Procedures Procedures   Medications Ordered in ED Medications - No data to display  Initial Impression / Assessment and Plan / ED Course  I have reviewed the triage vital signs and the nursing notes.  Pertinent labs & imaging results that were available during my care of the patient were reviewed by me and considered in my medical decision making (see chart for details).  Provided pt with I.S. Given his recent rib injury to assist with deep breathing.   Clinical Course as of Oct 16 1944  Fri Oct 16, 2016  1906 Mechanical fall from standing with reported head trauma. No LOC. No reported blood  thinners. No acute injuries from this fall. The patient does report that he had a fall earlier this week resulting in left rib pain with negative plain film on workup. Given patient's age and history of head trauma CT scan was obtained revealing no evidence of intracranial hemorrhage. No other imaging required at this time given the lack of acute injuries during this fall or exacerbation of prior injuries.    [PC]  1908 Safe for discharge with strict return precautions.  [PC]    Clinical Course User Index [PC] Cardama, Amadeo Garnet, MD      Final Clinical Impressions(s) / ED Diagnoses   Final diagnoses:  Fall, initial encounter  Minor head injury, initial encounter   Disposition: Discharge  Condition: Good  I have discussed the results, Dx and Tx plan with the patient who expressed understanding and agree(s) with the plan. Discharge instructions discussed at great length. The patient was given strict return precautions who verbalized understanding of the instructions. No further questions at time of discharge.    New Prescriptions   No medications on file    Follow Up: Vernon Prey, NP [address] [place] [address] [phone]  Schedule an appointment as soon as possible for a visit     I personally performed the services described in this documentation, which was scribed in my presence. The recorded information has been reviewed and is accurate.        Nira Conn, MD 10/16/16 [phone]

## 2016-10-16 NOTE — ED Triage Notes (Addendum)
EMS reports patient from home where he had a slip and fall today, hit posterior head, has left knee pain, denies being on anticoagulation, denies LOC. EMS VS: 138/72, HR 82, BS 145. Pt reports ETOH use today.

## 2017-01-31 ENCOUNTER — Encounter (HOSPITAL_BASED_OUTPATIENT_CLINIC_OR_DEPARTMENT_OTHER): Payer: Self-pay | Admitting: Emergency Medicine

## 2017-01-31 ENCOUNTER — Emergency Department (HOSPITAL_BASED_OUTPATIENT_CLINIC_OR_DEPARTMENT_OTHER): Payer: Medicare Other

## 2017-01-31 ENCOUNTER — Emergency Department (HOSPITAL_BASED_OUTPATIENT_CLINIC_OR_DEPARTMENT_OTHER)
Admission: EM | Admit: 2017-01-31 | Discharge: 2017-01-31 | Disposition: A | Discharge status: Home / Self Care | Payer: Medicare Other | Attending: Emergency Medicine | Admitting: Emergency Medicine

## 2017-01-31 DIAGNOSIS — E119 Type 2 diabetes mellitus without complications: Secondary | ICD-10-CM | POA: Insufficient documentation

## 2017-01-31 DIAGNOSIS — W06XXXA Fall from bed, initial encounter: Secondary | ICD-10-CM | POA: Diagnosis not present

## 2017-01-31 DIAGNOSIS — Y929 Unspecified place or not applicable: Secondary | ICD-10-CM | POA: Diagnosis not present

## 2017-01-31 DIAGNOSIS — S20221A Contusion of right back wall of thorax, initial encounter: Secondary | ICD-10-CM | POA: Insufficient documentation

## 2017-01-31 DIAGNOSIS — Z79899 Other long term (current) drug therapy: Secondary | ICD-10-CM | POA: Insufficient documentation

## 2017-01-31 DIAGNOSIS — Y939 Activity, unspecified: Secondary | ICD-10-CM | POA: Diagnosis not present

## 2017-01-31 DIAGNOSIS — I10 Essential (primary) hypertension: Secondary | ICD-10-CM | POA: Insufficient documentation

## 2017-01-31 DIAGNOSIS — S29002A Unspecified injury of muscle and tendon of back wall of thorax, initial encounter: Secondary | ICD-10-CM | POA: Insufficient documentation

## 2017-01-31 DIAGNOSIS — K5900 Constipation, unspecified: Secondary | ICD-10-CM | POA: Diagnosis present

## 2017-01-31 DIAGNOSIS — Z87891 Personal history of nicotine dependence: Secondary | ICD-10-CM | POA: Diagnosis not present

## 2017-01-31 DIAGNOSIS — Y999 Unspecified external cause status: Secondary | ICD-10-CM | POA: Diagnosis not present

## 2017-01-31 DIAGNOSIS — W19XXXA Unspecified fall, initial encounter: Secondary | ICD-10-CM

## 2017-01-31 MED ORDER — FLEET ENEMA 7-19 GM/118ML RE ENEM
1.0000 | ENEMA | Freq: Once | RECTAL | Status: AC
  Administered 2017-01-31: 1 via RECTAL
  Filled 2017-01-31: qty 1

## 2017-01-31 NOTE — ED Provider Notes (Signed)
[facility]-EMERGENCY DEPT Provider Note   CSN: [id number] Arrival date & time: 01/31/17  2595     History   Chief Complaint Chief Complaint  Patient presents with  . Constipation    HPI Gary [name] is a 83 y.o. male.  HPI   Last bowel movement was 3 days ago.  Tried miralax without relief.  Has history of constipation. No vomiting or abdominal pain. Enema in the ED worked quickly in the past and would like enema again.  Fell 3 days ago, hurt right back and left elbow.  Pain is 2/10, worse with certain positions.  Fell from bed and bottom hit the floor and lower back hit the handle of cabinet.  Elbow scraped, they placed bandage, reports no pain.  Last tetanus shot was in last 5 years.  No head trauma, no headaches, not on blood thinners.  Past Medical History:  Diagnosis Date  . Acid reflux   . Anemia   . Atrial fibrillation (HCC)   . BPH (benign prostatic hyperplasia)   . Bursitis   . Constipation   . Diabetes mellitus   . Diverticulosis   . Edema   . Erectile dysfunction   . Hyperlipidemia   . Hypertension   . Renal disorder   . Thoracic discitis     Patient Active Problem List   Diagnosis Date Noted  . Hypertension   . Acid reflux   . Erectile dysfunction   . Atrial fibrillation (HCC)   . Edema   . Thoracic discitis     Past Surgical History:  Procedure Laterality Date  . BUNIONECTOMY Right   . TONSILLECTOMY    . TOTAL KNEE ARTHROPLASTY     L       Home Medications    Prior to Admission medications   Medication Sig Start Date End Date Taking? Authorizing Provider  albuterol (PROVENTIL HFA;VENTOLIN HFA) 108 (90 BASE) MCG/ACT inhaler Inhale 2 puffs into the lungs every 6 (six) hours as needed for wheezing or shortness of breath.    [provider]  ATORVASTATIN CALCIUM PO Take by mouth.    [provider]  bisacodyl (DULCOLAX) 5 MG EC tablet Take 5 mg by mouth daily as needed for moderate constipation.    [provider]    cholecalciferol (VITAMIN D) 1000 units tablet Take 1,000 Units by mouth daily.    [provider]  diltiazem (DILACOR XR) 120 MG 24 hr capsule Take 120 mg by mouth daily.    [provider]  diphenhydrAMINE (BENADRYL) 25 MG tablet Take 1 tablet (25 mg total) by mouth every 6 (six) hours. 11/07/11 08/06/13  Susy Frizzle, MD  docusate sodium (COLACE) 100 MG capsule Take 100 mg by mouth 2 (two) times daily.    [provider]  furosemide (LASIX) 20 MG tablet Take 20 mg by mouth as needed. 02/03/13   Lewayne Bunting, MD  glyBURIDE (DIABETA) 1.25 MG tablet Take 1.25 mg by mouth daily with breakfast.    [provider]  pantoprazole (PROTONIX) 40 MG tablet Take 40 mg by mouth daily.    [provider]  polyethylene glycol (MIRALAX / GLYCOLAX) packet Take 1 packet in 8 ounce glass of water one to 2 times daily until results.  After that take 1 packet daily. 08/06/13   Nelva Nay, MD  Tamsulosin HCl (FLOMAX) 0.4 MG CAPS Take 0.4 mg by mouth daily.     [provider]  testosterone (ANDROGEL) 50 MG/5GM GEL Place 5  g onto the skin daily.    [provider]    Family History Family History  Problem Relation Age of Onset  . Heart disease Mother     Social History Social History  Substance Use Topics  . Smoking status: Former Games developer  . Smokeless tobacco: Never Used  . Alcohol use Yes     Comment: Occasional     Allergies   Ace inhibitors; Aspirin; Ibuprofen; Keflex [cephalexin]; Nsaids; and Rofecoxib   Review of Systems Review of Systems  Constitutional: Negative for fever.  HENT: Negative for sore throat.   Eyes: Negative for visual disturbance.  Respiratory: Negative for shortness of breath.   Cardiovascular: Negative for chest pain.  Gastrointestinal: Positive for constipation. Negative for abdominal pain, nausea and vomiting.  Genitourinary: Negative for difficulty urinating.  Musculoskeletal: Positive for back pain.  Negative for neck stiffness.  Skin: Negative for rash.  Neurological: Negative for syncope and headaches.     Physical Exam Updated Vital Signs BP (!) 149/72 (BP Location: Right Arm)   Pulse 89   Temp 98.2 F (36.8 C) (Oral)   Resp 16   Ht 5\' 10"  (1.778 m)   Wt 84.5 kg (186 lb 4.6 oz)   SpO2 100%   BMI 26.73 kg/m   Physical Exam  Constitutional: He is oriented to person, place, and time. He appears well-developed and well-nourished. No distress.  HENT:  Head: Normocephalic and atraumatic.  Eyes: Conjunctivae and EOM are normal.  Neck: Normal range of motion.  Cardiovascular: Normal rate, regular rhythm, normal heart sounds and intact distal pulses.  Exam reveals no gallop and no friction rub.   No murmur heard. Pulmonary/Chest: Effort normal and breath sounds normal. No respiratory distress. He has no wheezes. He has no rales.  Abdominal: Soft. He exhibits no distension. There is no tenderness. There is no guarding.  Musculoskeletal: He exhibits no edema.       Cervical back: He exhibits no tenderness and no bony tenderness.       Thoracic back: He exhibits no tenderness and no bony tenderness.       Lumbar back: He exhibits tenderness. He exhibits no bony tenderness.  Tenderness right lower back, contusion right lower back, no midline tenderness. Tenderness right lower ribs in back  Neurological: He is alert and oriented to person, place, and time.  Skin: Skin is warm and dry. He is not diaphoretic.  Nursing note and vitals reviewed.    ED Treatments / Results  Labs (all labs ordered are listed, but only abnormal results are displayed) Labs Reviewed - No data to display  EKG  EKG Interpretation None       Radiology Dg Chest 2 View  Result Date: 01/31/2017 CLINICAL DATA:  Fall 3 days ago. Right chest blunt trauma and pain. Initial encounter. EXAM: CHEST  2 VIEW COMPARISON:  10/06/2016 FINDINGS: The heart size is normal. Stable ectasia of thoracic aorta. Stable  mild left basilar scarring. No evidence pulmonary infiltrate or edema. No evidence of pleural effusion. Stable small calcified granuloma in left upper lobe. The visualized skeletal structures are unremarkable. IMPRESSION: Stable exam.  No active cardiopulmonary disease. Electronically Signed   By: Myles Rosenthal M.D.   On: 01/31/2017 10:38    Procedures Procedures (including critical care time)  Medications Ordered in ED Medications  sodium phosphate (FLEET) 7-19 GM/118ML enema 1 enema (1 enema Rectal Given 01/31/17 1024)     Initial Impression / Assessment and Plan / ED Course  I  have reviewed the triage vital signs and the nursing notes.  Pertinent labs & imaging results that were available during my care of the patient were reviewed by me and considered in my medical decision making (see chart for details).     83yo male with hx of atrial fibrillation not on anticoagulation presents with concern for constipation and also notes fall 2 days ago with right flank/lower back pain.  He has back right rib tenderness. XR Chest shows no acute abnormalities.  No significant pain with deep breaths, given 2 days since injury, do not feel further CT imaging indicated. No abdominal pain, stable vitals, no lightheadedness, doubt intraabdominal or retroperitoneal injury.  No midline back pain or tenderness. Suspect contusion of back with associated pain.  No hx of head trauma, headache, or LOC.    Regarding constipation, given enema with immediate improvement of symptoms. Doubt obstruction given passing flatus, no abdominal pain, no distention, no emesis. Patient reports he feels back to baseline. Discussed use of miralax if constipation returns. Patient discharged in stable condition with understanding of reasons to return.   Final Clinical Impressions(s) / ED Diagnoses   Final diagnoses:  Constipation, unspecified constipation type  Fall, initial encounter  Contusion of right side of back, initial  encounter    New Prescriptions Discharge Medication List as of 01/31/2017 10:55 AM       Alvira Monday, MD 02/01/17 2110

## 2017-01-31 NOTE — ED Notes (Signed)
ED Provider at bedside. 

## 2017-01-31 NOTE — ED Triage Notes (Signed)
Patient reports no bowel movement x 3 days.  States typically has a bowel movement every day.  Reports fall 3-4 days ago and has since not had any bowel movements.

## 2017-01-31 NOTE — ED Notes (Signed)
Patient transported to X-ray 

## 2017-01-31 NOTE — ED Notes (Signed)
Pt refused wheelchair. Ambulatory using cane, in NAD.

## 2017-01-31 NOTE — ED Notes (Signed)
This EMT in to chaperone rectal exam with MD

## 2017-06-28 ENCOUNTER — Emergency Department (HOSPITAL_COMMUNITY): Payer: Medicare Other

## 2017-06-28 ENCOUNTER — Emergency Department (HOSPITAL_COMMUNITY)
Admission: EM | Admit: 2017-06-28 | Discharge: 2017-06-30 | Disposition: A | Discharge status: Home / Self Care | Payer: Medicare Other | Attending: Emergency Medicine | Admitting: Emergency Medicine

## 2017-06-28 ENCOUNTER — Encounter (HOSPITAL_COMMUNITY): Payer: Self-pay | Admitting: Emergency Medicine

## 2017-06-28 DIAGNOSIS — Z79899 Other long term (current) drug therapy: Secondary | ICD-10-CM | POA: Diagnosis not present

## 2017-06-28 DIAGNOSIS — Z87891 Personal history of nicotine dependence: Secondary | ICD-10-CM | POA: Insufficient documentation

## 2017-06-28 DIAGNOSIS — R0789 Other chest pain: Secondary | ICD-10-CM | POA: Insufficient documentation

## 2017-06-28 DIAGNOSIS — Z96652 Presence of left artificial knee joint: Secondary | ICD-10-CM | POA: Diagnosis not present

## 2017-06-28 DIAGNOSIS — R42 Dizziness and giddiness: Secondary | ICD-10-CM

## 2017-06-28 DIAGNOSIS — I10 Essential (primary) hypertension: Secondary | ICD-10-CM | POA: Diagnosis not present

## 2017-06-28 LAB — CBC WITH DIFFERENTIAL/PLATELET
BASOS ABS: 0 10*3/uL (ref 0.0–0.1)
BASOS PCT: 0 %
EOS ABS: 0.2 10*3/uL (ref 0.0–0.7)
EOS PCT: 3 %
HCT: 44.5 % (ref 39.0–52.0)
Hemoglobin: 15 g/dL (ref 13.0–17.0)
Lymphocytes Relative: 25 %
Lymphs Abs: 1.5 10*3/uL (ref 0.7–4.0)
MCH: 32.9 pg (ref 26.0–34.0)
MCHC: 33.7 g/dL (ref 30.0–36.0)
MCV: 97.6 fL (ref 78.0–100.0)
Monocytes Absolute: 0.4 10*3/uL (ref 0.1–1.0)
Monocytes Relative: 7 %
Neutro Abs: 3.8 10*3/uL (ref 1.7–7.7)
Neutrophils Relative %: 65 %
PLATELETS: 170 10*3/uL (ref 150–400)
RBC: 4.56 MIL/uL (ref 4.22–5.81)
RDW: 12.9 % (ref 11.5–15.5)
WBC: 5.8 10*3/uL (ref 4.0–10.5)

## 2017-06-28 LAB — BASIC METABOLIC PANEL
Anion gap: 11 (ref 5–15)
BUN: 24 mg/dL — AB (ref 6–20)
CALCIUM: 8.9 mg/dL (ref 8.9–10.3)
CO2: 24 mmol/L (ref 22–32)
CREATININE: 1.36 mg/dL — AB (ref 0.61–1.24)
Chloride: 108 mmol/L (ref 101–111)
GFR calc Af Amer: 50 mL/min — ABNORMAL LOW (ref >60)
GFR, EST NON AFRICAN AMERICAN: 44 mL/min — AB (ref >60)
Glucose, Bld: 148 mg/dL — ABNORMAL HIGH (ref 65–99)
POTASSIUM: 4.3 mmol/L (ref 3.5–5.1)
SODIUM: 143 mmol/L (ref 135–145)

## 2017-06-28 LAB — CBG MONITORING, ED: GLUCOSE-CAPILLARY: 146 mg/dL — AB (ref 65–99)

## 2017-06-28 LAB — TROPONIN I

## 2017-06-28 MED ORDER — SODIUM CHLORIDE 0.9 % IV BOLUS (SEPSIS)
500.0000 mL | Freq: Once | INTRAVENOUS | Status: AC
  Administered 2017-06-28: 500 mL via INTRAVENOUS

## 2017-06-28 NOTE — ED Notes (Signed)
Returned from xray

## 2017-06-28 NOTE — ED Triage Notes (Signed)
Pt here with c/o being dizzy when he stands , pt states that he feels fine lying down ,

## 2017-06-28 NOTE — ED Provider Notes (Signed)
[name] EMERGENCY DEPARTMENT Provider Note   CSN: [id number] Arrival date & time: 06/28/17  4098     History   Chief Complaint Chief Complaint  Patient presents with  . Dizziness    HPI Gary [name] is a 84 y.o. male.  HPI  84 year old male with a history of diabetes, atrial fibrillation and hypertension presents with dizziness.  He states he noticed it when he first woke up around 5 AM.  He did not eat dinner last night because he had a full and late lunch and then went out to see a show.  Since around 5 AM when he stands up he feels lightheaded after a few seconds.  It lasts only a couple seconds and then he is able to walk without difficulty. There is no room spinning sensation, and feels like he might pass out.  He denies headache or shortness of breath.  He has been having dull chest pain for the last couple weeks.  He states it seems to come and go and is not currently present.  He did have some earlier today.  Sometimes he notices it when walking but this is not consistent. He has noticed tenderness over his sternum and thinks he might have strained his chest wall.  He has chronic lower extremity swelling that waxes and wanes throughout the day.  He did not eat breakfast this morning.  Past Medical History:  Diagnosis Date  . Acid reflux   . Anemia   . Atrial fibrillation (HCC)   . BPH (benign prostatic hyperplasia)   . Bursitis   . Constipation   . Diabetes mellitus   . Diverticulosis   . Edema   . Erectile dysfunction   . Hyperlipidemia   . Hypertension   . Renal disorder   . Thoracic discitis     Patient Active Problem List   Diagnosis Date Noted  . Hypertension   . Acid reflux   . Erectile dysfunction   . Atrial fibrillation (HCC)   . Edema   . Thoracic discitis     Past Surgical History:  Procedure Laterality Date  . BUNIONECTOMY Right   . TONSILLECTOMY    . TOTAL KNEE ARTHROPLASTY     L       Home Medications     Prior to Admission medications   Medication Sig Start Date End Date Taking? Authorizing Provider  albuterol (PROVENTIL HFA;VENTOLIN HFA) 108 (90 BASE) MCG/ACT inhaler Inhale 2 puffs into the lungs every 6 (six) hours as needed for wheezing or shortness of breath.    [provider]  ATORVASTATIN CALCIUM PO Take by mouth.    [provider]  bisacodyl (DULCOLAX) 5 MG EC tablet Take 5 mg by mouth daily as needed for moderate constipation.    [provider]  cholecalciferol (VITAMIN D) 1000 units tablet Take 1,000 Units by mouth daily.    [provider]  diltiazem (DILACOR XR) 120 MG 24 hr capsule Take 120 mg by mouth daily.    [provider]  diphenhydrAMINE (BENADRYL) 25 MG tablet Take 1 tablet (25 mg total) by mouth every 6 (six) hours. 11/07/11 08/06/13  Susy Frizzle, MD  docusate sodium (COLACE) 100 MG capsule Take 100 mg by mouth 2 (two) times daily.    [provider]  furosemide (LASIX) 20 MG tablet Take 20 mg by mouth as needed. 02/03/13   Lewayne Bunting, MD  glyBURIDE (DIABETA) 1.25 MG tablet Take 1.25 mg by mouth  daily with breakfast.    [provider]  pantoprazole (PROTONIX) 40 MG tablet Take 40 mg by mouth daily.    [provider]  polyethylene glycol (MIRALAX / GLYCOLAX) packet Take 1 packet in 8 ounce glass of water one to 2 times daily until results.  After that take 1 packet daily. 08/06/13   Nelva NayBeaton, Robert, MD  Tamsulosin HCl (FLOMAX) 0.4 MG CAPS Take 0.4 mg by mouth daily.     [provider]  testosterone (ANDROGEL) 50 MG/5GM GEL Place 5 g onto the skin daily.    [provider]    Family History Family History  Problem Relation Age of Onset  . Heart disease Mother     Social History Social History   Tobacco Use  . Smoking status: Former Games developermoker  . Smokeless tobacco: Never Used  Substance Use Topics  . Alcohol use: Yes    Comment: Occasional  . Drug use: No      Allergies   Ace inhibitors; Aspirin; Ibuprofen; Keflex [cephalexin]; Nsaids; and Rofecoxib   Review of Systems Review of Systems  Constitutional: Negative for fever.  Respiratory: Negative for shortness of breath.   Cardiovascular: Positive for chest pain.  Gastrointestinal: Negative for abdominal pain and vomiting.  Neurological: Positive for light-headedness. Negative for syncope, weakness, numbness and headaches.  All other systems reviewed and are negative.    Physical Exam Updated Vital Signs BP (!) 195/92   Pulse 67   Temp 97.7 F (36.5 C) (Oral)   Resp 17   SpO2 97%   Physical Exam  Constitutional: He is oriented to person, place, and time. He appears well-developed and well-nourished. No distress.  HENT:  Head: Normocephalic and atraumatic.  Right Ear: External ear normal.  Left Ear: External ear normal.  Nose: Nose normal.  Eyes: EOM are normal. Pupils are equal, round, and reactive to light. Right eye exhibits no discharge. Left eye exhibits no discharge.  Neck: Neck supple.  Cardiovascular: Normal rate, regular rhythm and normal heart sounds.  Pulmonary/Chest: Effort normal and breath sounds normal. He exhibits tenderness (mild, over sternum).  Abdominal: Soft. There is no tenderness.  Musculoskeletal: He exhibits edema (mild bilateral pitting edema of feet/ankles).  Neurological: He is alert and oriented to person, place, and time.  CN 3-12 grossly intact. 5/5 strength in all 4 extremities. Grossly normal sensation. Normal finger to nose.   Skin: Skin is warm and dry. He is not diaphoretic.  Nursing note and vitals reviewed.    ED Treatments / Results  Labs (all labs ordered are listed, but only abnormal results are displayed) Labs Reviewed  BASIC METABOLIC PANEL - Abnormal; Notable for the following components:      Result Value   Glucose, Bld 148 (*)    BUN 24 (*)    Creatinine, Ser 1.36 (*)    GFR calc non Af Amer 44 (*)    GFR calc Af Amer  50 (*)    All other components within normal limits  CBG MONITORING, ED - Abnormal; Notable for the following components:   Glucose-Capillary 146 (*)    All other components within normal limits  CBC WITH DIFFERENTIAL/PLATELET  TROPONIN I    EKG  EKG Interpretation  Date/Time:  Monday June 28 2017 07:47:24 EST Ventricular Rate:  64 PR Interval:    QRS Duration: 150 QT Interval:  467 QTC Calculation: 482 R Axis:   -11 Text Interpretation:  Atrial fibrillation Right bundle branch block RBBB new since 2012  Confirmed by Pricilla Loveless [phone]) on 06/28/2017 7:55:22 AM       Radiology Dg Chest 2 View  Result Date: 06/28/2017 CLINICAL DATA:  Chest pain and weakness EXAM: CHEST  2 VIEW COMPARISON:  01/31/2017 FINDINGS: Chronic cardiomegaly and aortic tortuosity. Minimal scarring at the left base. There is no edema, consolidation, effusion, or pneumothorax. Spondylosis. IMPRESSION: No acute finding or change from prior. Electronically Signed   By: Marnee Spring M.D.   On: 06/28/2017 09:13    Procedures Procedures (including critical care time)  Medications Ordered in ED Medications  sodium chloride 0.9 % bolus 500 mL (0 mLs Intravenous Stopped 06/28/17 0939)     Initial Impression / Assessment and Plan / ED Course  I have reviewed the triage vital signs and the nursing notes.  Pertinent labs & imaging results that were available during my care of the patient were reviewed by me and considered in my medical decision making (see chart for details).     Patient's chest pain is atypical.  It is reproducible and he states has been ongoing for several weeks.  His initial troponin is negative and ECG does not show any acute ischemic changes.  He declines further workup for his chest pain and does not want to be admitted for this.  I discussed that while this could be muscular in nature as he suspects, he could also be having angina that would need to be addressed despite his  negative troponin.  He understands this but still wants to go home.  He does have A. fib but I do not think an arrhythmia as the cause of his dizziness/lightheadedness.  It seems to only be with standing up.  Orthostatics show increase in heart rate from 60-80.  He was given a small bolus of fluids.  He prefers to go home and follow-up with PCP and cardiology.  I think this is reasonable but did discuss strict return precautions, especially chest pain precautions.  Neuro exam benign.  Will need to follow-up with cardiology as they have been managing his atrial fibrillation.  He has a chadsvasc of 5 but is not currently on anticoagulation.  Unclear at this time why.  CHA2DS2/VAS Stroke Risk Points      5 >= 2 Points: High Risk  1 - 1.99 Points: Medium Risk  0 Points: Low Risk    This is the only CHA2DS2/VAS Stroke Risk Points available for the past  year.:  Change: N/A     Details    This score determines the patient's risk of having a stroke if the  patient has atrial fibrillation.       Points Metrics  0 Has Congestive Heart Failure:  No   1 Has Vascular Disease:  Yes    1 Has Hypertension:  Yes   2 Age:  40   1 Has Diabetes:  Yes    0 Had Stroke:  No  Had TIA:  No  Had thromboembolism:  No   0 Male:  No            Final Clinical Impressions(s) / ED Diagnoses   Final diagnoses:  Orthostatic dizziness  Dull chest pain    ED Discharge Orders    None       Pricilla Loveless, MD 06/28/17 1032

## 2017-06-28 NOTE — ED Notes (Signed)
CBG 156 

## 2018-07-09 ENCOUNTER — Emergency Department (HOSPITAL_BASED_OUTPATIENT_CLINIC_OR_DEPARTMENT_OTHER)
Admission: EM | Admit: 2018-07-09 | Discharge: 2018-07-09 | Disposition: A | Discharge status: Home / Self Care | Payer: Medicare Other | Attending: Emergency Medicine | Admitting: Emergency Medicine

## 2018-07-09 ENCOUNTER — Emergency Department (HOSPITAL_BASED_OUTPATIENT_CLINIC_OR_DEPARTMENT_OTHER): Payer: Medicare Other

## 2018-07-09 ENCOUNTER — Other Ambulatory Visit: Payer: Self-pay

## 2018-07-09 ENCOUNTER — Encounter (HOSPITAL_BASED_OUTPATIENT_CLINIC_OR_DEPARTMENT_OTHER): Payer: Self-pay | Admitting: Emergency Medicine

## 2018-07-09 DIAGNOSIS — Z96652 Presence of left artificial knee joint: Secondary | ICD-10-CM | POA: Insufficient documentation

## 2018-07-09 DIAGNOSIS — Z87891 Personal history of nicotine dependence: Secondary | ICD-10-CM | POA: Insufficient documentation

## 2018-07-09 DIAGNOSIS — Y998 Other external cause status: Secondary | ICD-10-CM | POA: Diagnosis not present

## 2018-07-09 DIAGNOSIS — E119 Type 2 diabetes mellitus without complications: Secondary | ICD-10-CM | POA: Insufficient documentation

## 2018-07-09 DIAGNOSIS — S0083XA Contusion of other part of head, initial encounter: Secondary | ICD-10-CM | POA: Diagnosis not present

## 2018-07-09 DIAGNOSIS — W0110XA Fall on same level from slipping, tripping and stumbling with subsequent striking against unspecified object, initial encounter: Secondary | ICD-10-CM | POA: Diagnosis not present

## 2018-07-09 DIAGNOSIS — Z23 Encounter for immunization: Secondary | ICD-10-CM | POA: Diagnosis not present

## 2018-07-09 DIAGNOSIS — I10 Essential (primary) hypertension: Secondary | ICD-10-CM | POA: Insufficient documentation

## 2018-07-09 DIAGNOSIS — S0990XA Unspecified injury of head, initial encounter: Secondary | ICD-10-CM

## 2018-07-09 DIAGNOSIS — Y9301 Activity, walking, marching and hiking: Secondary | ICD-10-CM | POA: Diagnosis not present

## 2018-07-09 DIAGNOSIS — Z79899 Other long term (current) drug therapy: Secondary | ICD-10-CM | POA: Diagnosis not present

## 2018-07-09 DIAGNOSIS — S0181XA Laceration without foreign body of other part of head, initial encounter: Secondary | ICD-10-CM | POA: Diagnosis not present

## 2018-07-09 DIAGNOSIS — Y929 Unspecified place or not applicable: Secondary | ICD-10-CM | POA: Insufficient documentation

## 2018-07-09 MED ORDER — TETANUS-DIPHTH-ACELL PERTUSSIS 5-2.5-18.5 LF-MCG/0.5 IM SUSP
0.5000 mL | Freq: Once | INTRAMUSCULAR | Status: AC
  Administered 2018-07-09: 0.5 mL via INTRAMUSCULAR
  Filled 2018-07-09: qty 0.5

## 2018-07-09 MED ORDER — LIDOCAINE-EPINEPHRINE (PF) 2 %-1:200000 IJ SOLN
10.0000 mL | Freq: Once | INTRAMUSCULAR | Status: AC
  Administered 2018-07-09: 10 mL
  Filled 2018-07-09 (×2): qty 10

## 2018-07-09 NOTE — ED Triage Notes (Signed)
Pt here after mechanical fall this morning falling on face with no LOC. Lac to left eyebrow and bridge of nose. Bleeding still present. Wound was cleaned in triage. No blood thinners.

## 2018-07-09 NOTE — Discharge Instructions (Addendum)
You were seen in the emergency department for injuries from a fall.  You had a CAT scan of your head and face that did not show any significant findings.  You had a laceration that was sutured and the stitches will need to be removed in about 5 or 6 days.  You should use soap and water to keep the area clean.  Tylenol for pain.  Return if any concerns.

## 2018-07-09 NOTE — ED Provider Notes (Signed)
[facility] EMERGENCY DEPARTMENT Provider Note   CSN: [id number] Arrival date & time: 07/09/18  1025     History   Chief Complaint Chief Complaint  Patient presents with  . Fall  . Facial Laceration    HPI Gary [name] is a 85 y.o. male.  He is here for evaluation of injuries from a mechanical fall earlier this morning.  He said he was walking around his car and tripped on the curb and fell and struck his face.  He denies any loss of consciousness.  He is here for evaluation of laceration on his forehead and some nose pain, concerned that it may be broken.  He also has some abrasions on his hand.  Unclear of his last tetanus shot.  He denies any numbness or weakness no vomiting.  No blurry vision or double vision he has been ambulatory since the incident and is complaining of only mild pain.  The history is provided by the patient.  Fall  This is a new problem. The current episode started 1 to 2 hours ago. The problem has been resolved. Pertinent negatives include no chest pain, no abdominal pain, no headaches and no shortness of breath. Nothing aggravates the symptoms. Nothing relieves the symptoms. He has tried nothing for the symptoms. The treatment provided no relief.    Past Medical History:  Diagnosis Date  . Acid reflux   . Anemia   . Atrial fibrillation (HCC)   . BPH (benign prostatic hyperplasia)   . Bursitis   . Constipation   . Diabetes mellitus   . Diverticulosis   . Edema   . Erectile dysfunction   . Hyperlipidemia   . Hypertension   . Renal disorder   . Thoracic discitis     Patient Active Problem List   Diagnosis Date Noted  . Hypertension   . Acid reflux   . Erectile dysfunction   . Atrial fibrillation (HCC)   . Edema   . Thoracic discitis     Past Surgical History:  Procedure Laterality Date  . BUNIONECTOMY Right   . TONSILLECTOMY    . TOTAL KNEE ARTHROPLASTY     L        Home Medications    Prior to Admission medications    Medication Sig Start Date End Date Taking? Authorizing Provider  amLODipine (NORVASC) 5 MG tablet Take 5 mg by mouth daily.   Yes [provider]  ATORVASTATIN CALCIUM PO Take 10 mg by mouth daily.    Yes [provider]  docusate sodium (COLACE) 100 MG capsule Take 100 mg by mouth 2 (two) times daily.   Yes [provider]  ergocalciferol (DRISDOL) 200 MCG/ML drops Take 2,000 Units by mouth daily.   Yes [provider]  furosemide (LASIX) 20 MG tablet Take 20 mg by mouth as needed. 02/03/13  Yes Lewayne Buntingrenshaw, Brian S, MD  glyBURIDE (DIABETA) 1.25 MG tablet Take 1.25 mg by mouth daily with breakfast.   Yes [provider]  ipratropium (ATROVENT) 0.03 % nasal spray Place 2 sprays into both nostrils every 12 (twelve) hours.   Yes [provider]  oxybutynin (DITROPAN-XL) 10 MG 24 hr tablet Take 10 mg by mouth at bedtime.   Yes [provider]  pantoprazole (PROTONIX) 40 MG tablet Take 40 mg by mouth daily.   Yes [provider]  polyethylene glycol (MIRALAX / GLYCOLAX) packet Take 1 packet in 8 ounce glass of water one to 2 times daily until  results.  After that take 1 packet daily. 08/06/13  Yes Nelva Nay, MD  albuterol (PROVENTIL HFA;VENTOLIN HFA) 108 (90 BASE) MCG/ACT inhaler Inhale 2 puffs into the lungs every 6 (six) hours as needed for wheezing or shortness of breath.    [provider]  bisacodyl (DULCOLAX) 5 MG EC tablet Take 5 mg by mouth daily as needed for moderate constipation.    [provider]  cholecalciferol (VITAMIN D) 1000 units tablet Take 1,000 Units by mouth daily.    [provider]  diltiazem (DILACOR XR) 120 MG 24 hr capsule Take 120 mg by mouth daily.    [provider]  diphenhydrAMINE (BENADRYL) 25 MG tablet Take 1 tablet (25 mg total) by mouth every 6 (six) hours. 11/07/11 08/06/13  Susy Frizzle, MD  Tamsulosin HCl (FLOMAX) 0.4 MG CAPS Take 0.4 mg by mouth daily.      [provider]  testosterone (ANDROGEL) 50 MG/5GM GEL Place 5 g onto the skin daily.    [provider]    Family History Family History  Problem Relation Age of Onset  . Heart disease Mother     Social History Social History   Tobacco Use  . Smoking status: Former Games developer  . Smokeless tobacco: Never Used  Substance Use Topics  . Alcohol use: Yes    Comment: Occasional  . Drug use: No     Allergies   Ace inhibitors; Aspirin; Ibuprofen; Keflex [cephalexin]; Nsaids; and Rofecoxib   Review of Systems Review of Systems  Constitutional: Negative for fever.  HENT: Negative for sore throat.   Eyes: Negative for visual disturbance.  Respiratory: Negative for shortness of breath.   Cardiovascular: Negative for chest pain.  Gastrointestinal: Negative for abdominal pain.  Genitourinary: Negative for dysuria.  Skin: Positive for wound. Negative for rash.  Neurological: Negative for headaches.     Physical Exam Updated Vital Signs BP (!) 142/87 (BP Location: Right Arm)   Pulse 70   Temp 97.7 F (36.5 C) (Oral)   Resp 16   SpO2 98%   Physical Exam Vitals signs and nursing note reviewed.  Constitutional:      Appearance: He is well-developed.  HENT:     Head: Normocephalic.     Comments: He has approximately 3 cm laceration above his left eyebrow.  There is also a small abrasion on the bridge of his nose.  Midface is stable and there is no trismus.    Mouth/Throat:     Mouth: Mucous membranes are moist.  Eyes:     Extraocular Movements: Extraocular movements intact.     Conjunctiva/sclera: Conjunctivae normal.  Neck:     Musculoskeletal: Neck supple.  Pulmonary:     Effort: Pulmonary effort is normal.  Musculoskeletal: Normal range of motion.        General: No tenderness or deformity.     Comments: Few small abrasions over his left hand.  Skin:    General: Skin is warm and dry.     Capillary Refill: Capillary refill takes less than 2 seconds.   Neurological:     General: No focal deficit present.     Mental Status: He is alert.     GCS: GCS eye subscore is 4. GCS verbal subscore is 5. GCS motor subscore is 6.     Gait: Gait normal.      ED Treatments / Results  Labs (all labs ordered are listed, but only abnormal results are displayed) Labs Reviewed - No data to  display  EKG None  Radiology Ct Head Wo Contrast  Result Date: 07/09/2018 CLINICAL DATA:  Fall.  Left periorbital laceration. EXAM: CT HEAD WITHOUT CONTRAST CT MAXILLOFACIAL WITHOUT CONTRAST TECHNIQUE: Multidetector CT imaging of the head and maxillofacial structures were performed using the standard protocol without intravenous contrast. Multiplanar CT image reconstructions of the maxillofacial structures were also generated. COMPARISON:  CT head dated Oct 16, 2016. FINDINGS: CT HEAD FINDINGS Brain: No evidence of acute infarction, hemorrhage, hydrocephalus, extra-axial collection or mass lesion/mass effect. Stable mild atrophy and chronic microvascular ischemic changes. Unchanged prominent CSF space anterior to the brainstem potentially reflecting an arachnoid cyst. Vascular: Calcified atherosclerosis at the skullbase. No hyperdense vessel. Skull: Negative for fracture or focal lesion. Unchanged left parietal bone benign exostosis. 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 Other: None. CT MAXILLOFACIAL FINDINGS Osseous: No fracture or mandibular dislocation. No destructive process. Orbits: Negative. No traumatic or inflammatory finding. Sinuses: Small retention cysts in the left maxillary sinus. Remaining paranasal sinuses and mastoid air cells are clear. Soft tissues: Small left medial periorbital soft tissue hematoma extending along the left frontal process of the maxilla. IMPRESSION: 1.  No acute intracranial abnormality. 2. No acute maxillofacial fracture. Small left medial periorbital soft tissue hematoma.  Electronically Signed   By: Obie DredgeWilliam T Derry M.D.   On: 07/09/2018 11:38   Ct Maxillofacial Wo Cm  Result Date: 07/09/2018 CLINICAL DATA:  Fall.  Left periorbital laceration. EXAM: CT HEAD WITHOUT CONTRAST CT MAXILLOFACIAL WITHOUT CONTRAST TECHNIQUE: Multidetector CT imaging of the head and maxillofacial structures were performed using the standard protocol without intravenous contrast. Multiplanar CT image reconstructions of the maxillofacial structures were also generated. COMPARISON:  CT head dated Oct 16, 2016. FINDINGS: CT HEAD FINDINGS Brain: No evidence of acute infarction, hemorrhage, hydrocephalus, extra-axial collection or mass lesion/mass effect. Stable mild atrophy and chronic microvascular ischemic changes. Unchanged prominent CSF space anterior to the brainstem potentially reflecting an arachnoid cyst. Vascular: Calcified atherosclerosis at the skullbase. No hyperdense vessel. Skull: Negative for fracture or focal lesion. Unchanged left parietal bone benign exostosis. 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 Other: None. CT MAXILLOFACIAL FINDINGS Osseous: No fracture or mandibular dislocation. No destructive process. Orbits: Negative. No traumatic or inflammatory finding. Sinuses: Small retention cysts in the left maxillary sinus. Remaining paranasal sinuses and mastoid air cells are clear. Soft tissues: Small left medial periorbital soft tissue hematoma extending along the left frontal process of the maxilla. IMPRESSION: 1.  No acute intracranial abnormality. 2. No acute maxillofacial fracture. Small left medial periorbital soft tissue hematoma. Electronically Signed   By: Obie DredgeWilliam T Derry M.D.   On: 07/09/2018 11:38    Procedures .Marland Kitchen.Laceration Repair Date/Time: 07/09/2018 11:15 AM Performed by: Terrilee FilesButler,  C, MD Authorized by: Terrilee FilesButler,  C, MD   Consent:    Consent obtained:  Verbal   Consent given by:  Patient   Risks  discussed:  Infection, pain, poor cosmetic result, poor wound healing and retained foreign body   Alternatives discussed:  No treatment and delayed treatment Anesthesia (see MAR for exact dosages):    Anesthesia method:  Local infiltration   Local anesthetic:  Lidocaine 2% WITH epi Laceration details:    Location:  Face   Face location:  L eyebrow   Length (cm):  2.5 Repair type:    Repair type:  Simple Pre-procedure details:    Preparation:  Patient was prepped and draped in usual sterile  fashion Exploration:    Contaminated: no   Treatment:    Area cleansed with:  Saline Skin repair:    Repair method:  Sutures   Suture size:  6-0   Suture material:  Prolene   Suture technique:  Simple interrupted   Number of sutures:  3 Approximation:    Approximation:  Close Post-procedure details:    Dressing:  Antibiotic ointment   Patient tolerance of procedure:  Tolerated well, no immediate complications   (including critical care time)  Medications Ordered in ED Medications  lidocaine-EPINEPHrine (XYLOCAINE W/EPI) 2 %-1:200000 (PF) injection 10 mL (has no administration in time range)  Tdap (BOOSTRIX) injection 0.5 mL (has no administration in time range)     Initial Impression / Assessment and Plan / ED Course  I have reviewed the triage vital signs and the nursing notes.  Pertinent labs & imaging results that were available during my care of the patient were reviewed by me and considered in my medical decision making (see chart for details).      Final Clinical Impressions(s) / ED Diagnoses   Final diagnoses:  Injury of head, initial encounter  Contusion of face, initial encounter  Laceration of forehead, initial encounter    ED Discharge Orders    None       Terrilee Files, MD 07/09/18 1904

## 2019-01-23 IMAGING — CT CT HEAD W/O CM
3 series · 15 of 47 positions shown, 18 images · non-contrast
Comparison: Head CT 10 days prior 10/06/2016

CLINICAL DATA: Slip and fall today striking head.  Dizzy.

EXAM:
CT HEAD WITHOUT CONTRAST
TECHNIQUE: Contiguous axial images were obtained from the base of the skull
through the vertex without intravenous contrast.

[Series 2: head wo · axial · 0.49mm/px · z∈[-132,-7]mm · 9 of 31 slices shown, 12 images]
[im 3/31  brain]
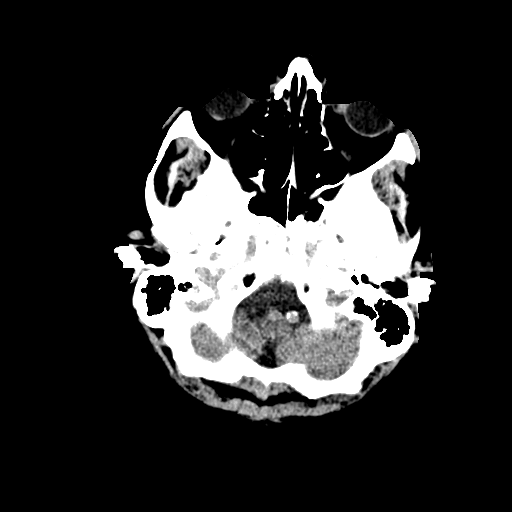
[im 3/31  bone]
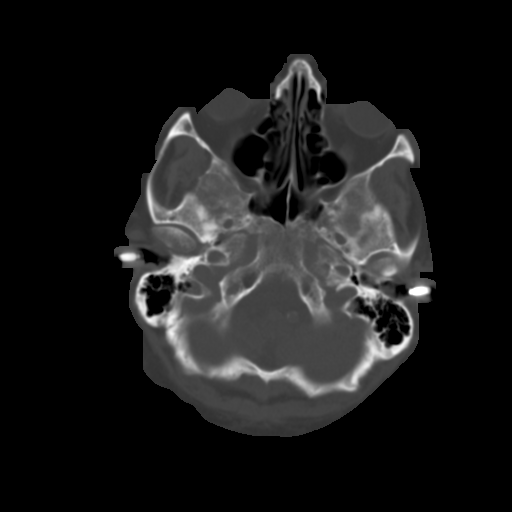
[im 6/31  brain]
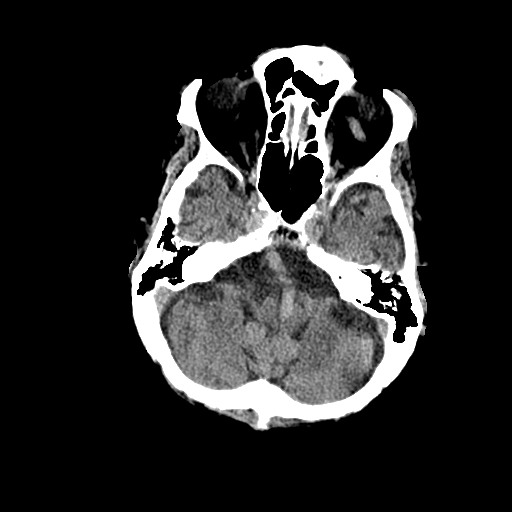
[im 9/31  brain]
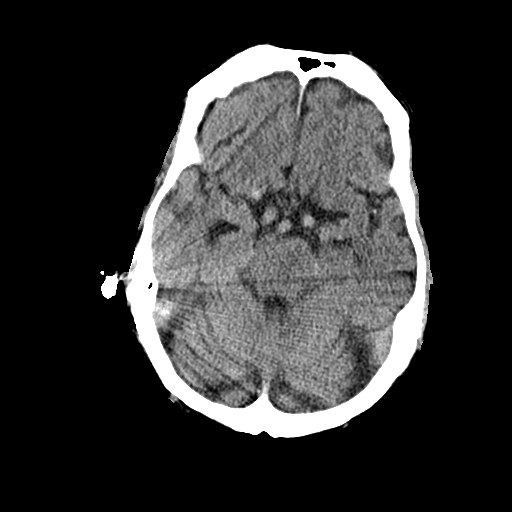
[im 12/31  brain]
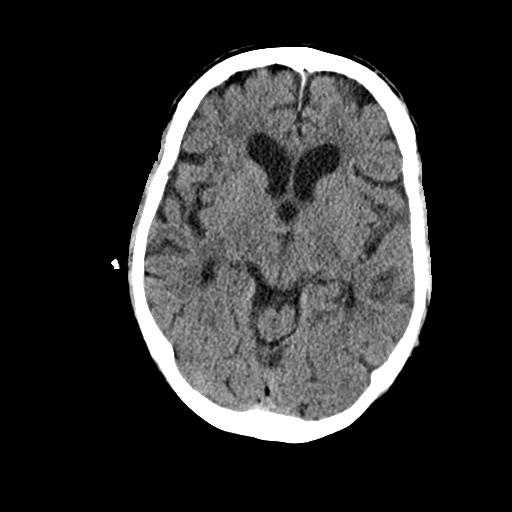
[im 16/31  brain]
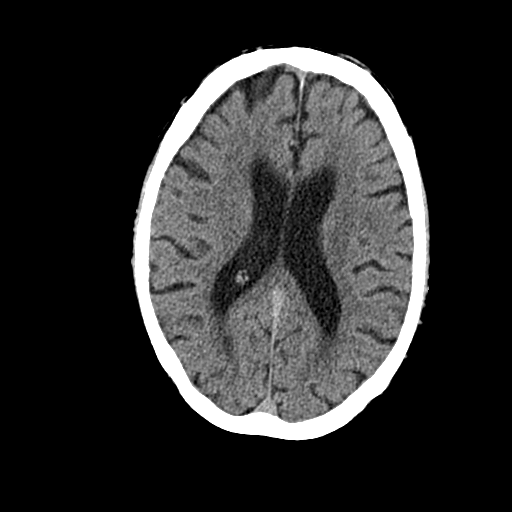
[im 16/31  bone]
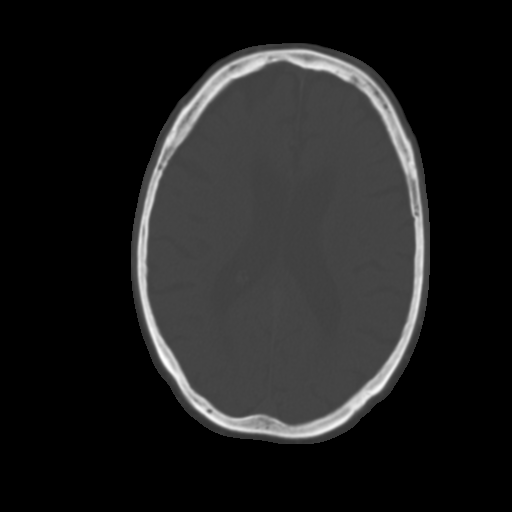
[im 19/31  brain]
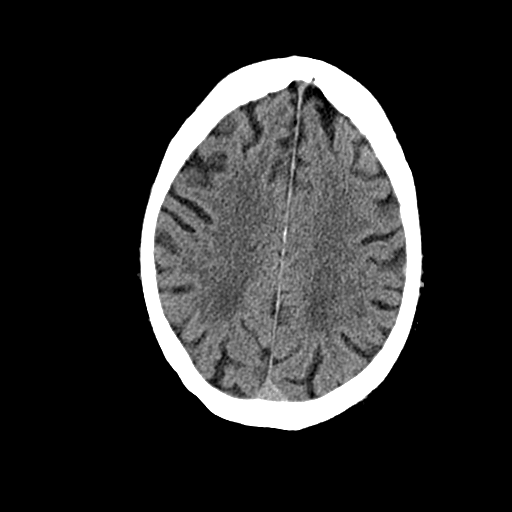
[im 22/31  brain]
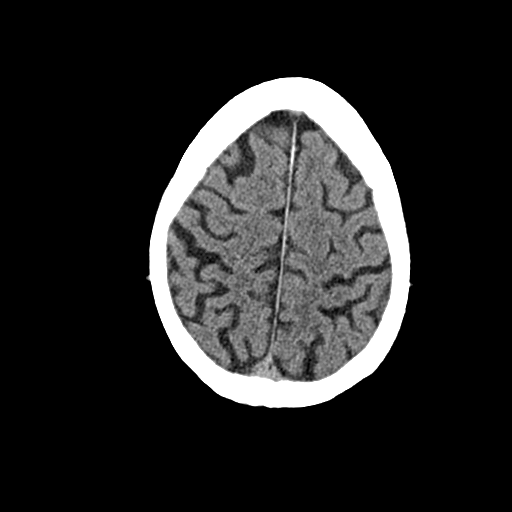
[im 25/31  brain]
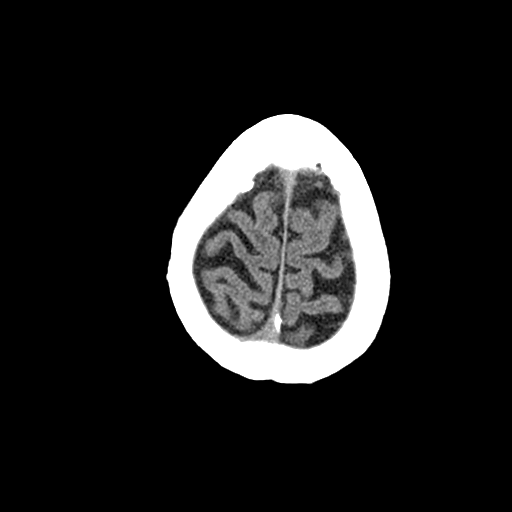
[im 28/31  brain]
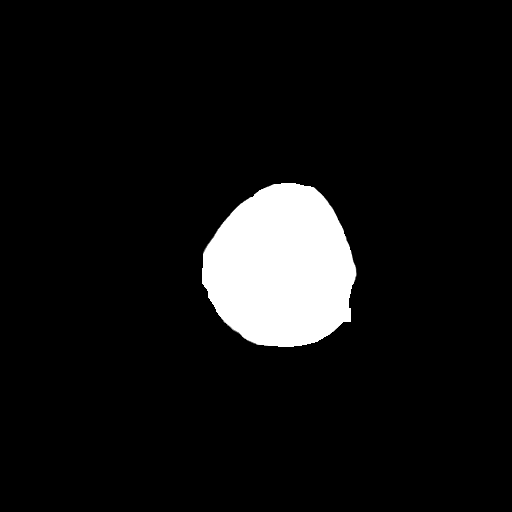
[im 28/31  bone]
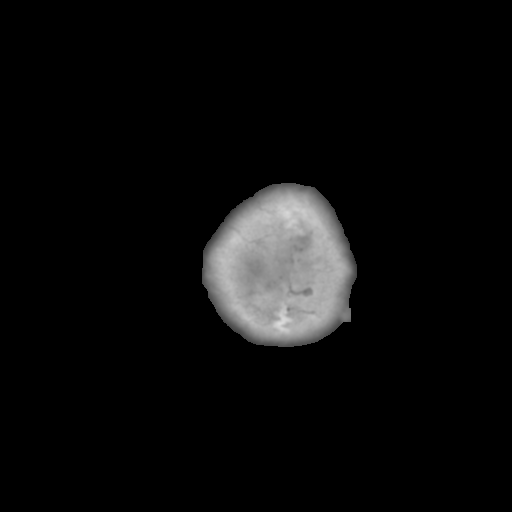

[Series 4: cor soft · coronal · 0.34mm/px · 3 of 70 slices shown]
[im 24/70  brain]
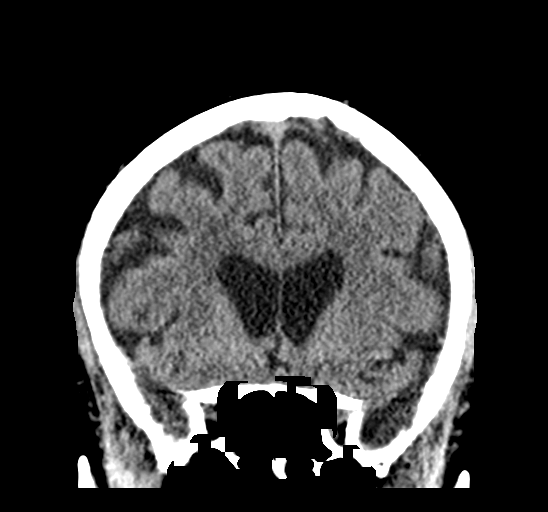
[im 31/70  brain]
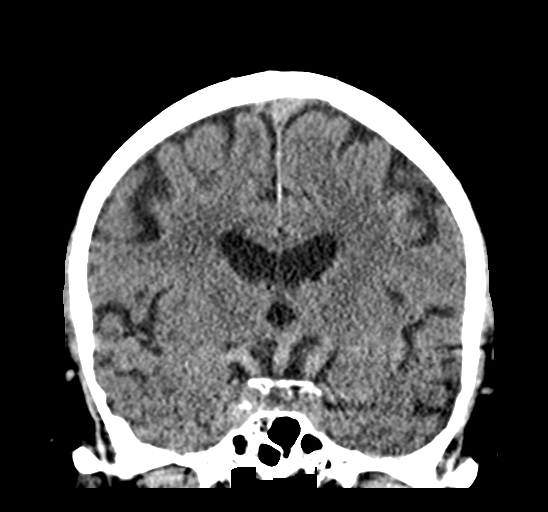
[im 39/70  brain]
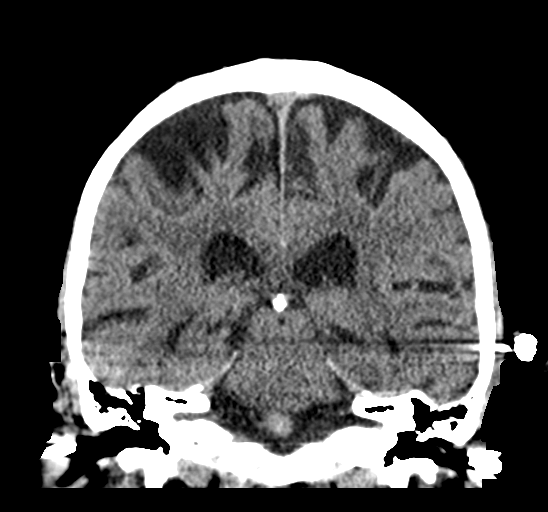

[Series 5: sag soft · sagittal · 0.31mm/px · 3 of 58 slices shown]
[im 20/58  brain]
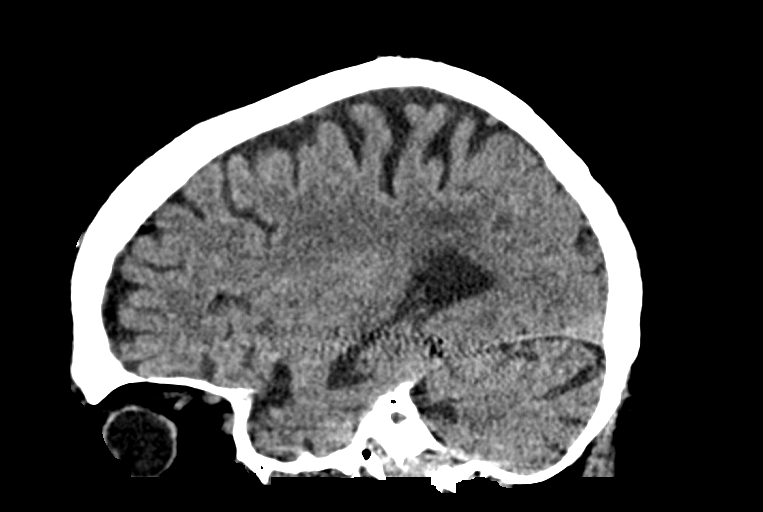
[im 29/58  brain]
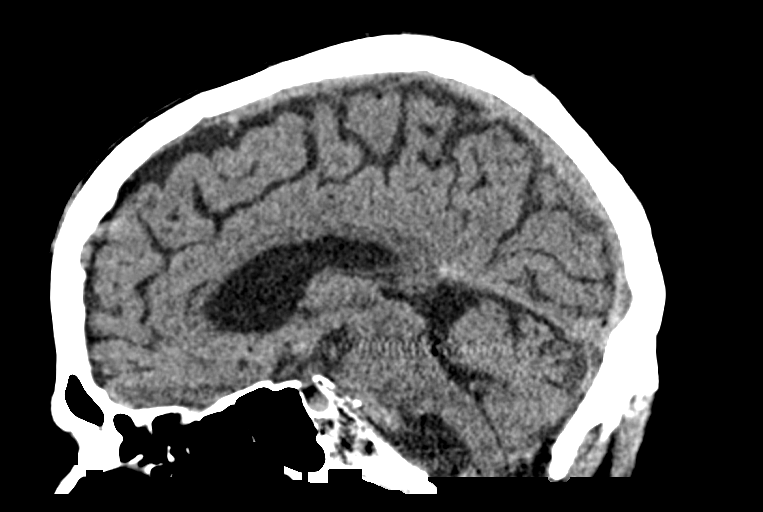
[im 39/58  brain]
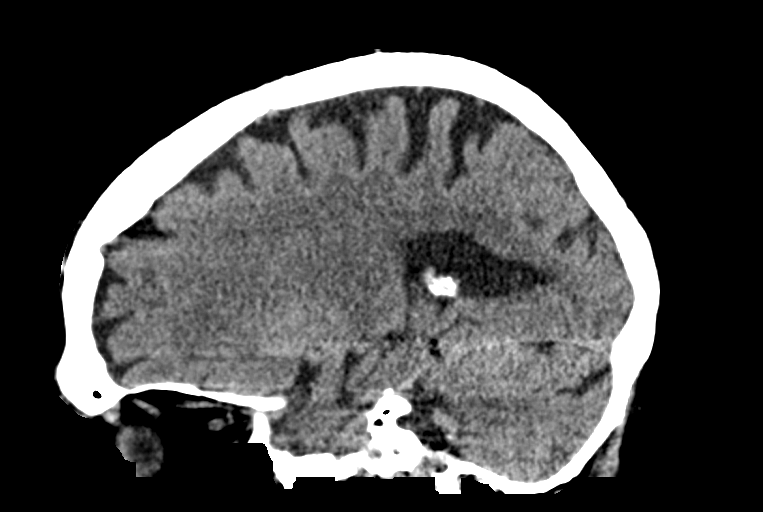

[15 of 47 positions shown; findings below may reference images not displayed]

FINDINGS: Brain: No intracranial hemorrhage. Stable atrophy and chronic small
vessel ischemia. No subdural or extra-axial fluid collection. No
midline shift or mass effect.

Vascular: Atherosclerosis and tortuosity of skullbase vasculature.
No hyperdense vessel.

Skull: No fracture. Stable exostosis from left parietal bone,
benign.

Sinuses/Orbits: Trace mucosal thickening of the ethmoid air cells.
No sinus fluid levels. Mastoid air cells are clear. The visualized
orbits are unremarkable.

Other: None
IMPRESSION: No acute intracranial abnormality. Stable atrophy and chronic small
vessel ischemia.

## 2020-01-10 ENCOUNTER — Emergency Department (HOSPITAL_COMMUNITY): Payer: Medicare Other

## 2020-01-10 ENCOUNTER — Other Ambulatory Visit: Payer: Self-pay

## 2020-01-10 ENCOUNTER — Emergency Department (HOSPITAL_COMMUNITY)
Admission: EM | Admit: 2020-01-10 | Discharge: 2020-01-11 | Disposition: A | Discharge status: Home / Self Care | Payer: Medicare Other | Attending: Emergency Medicine | Admitting: Emergency Medicine

## 2020-01-10 DIAGNOSIS — Z79899 Other long term (current) drug therapy: Secondary | ICD-10-CM | POA: Insufficient documentation

## 2020-01-10 DIAGNOSIS — Y939 Activity, unspecified: Secondary | ICD-10-CM | POA: Diagnosis not present

## 2020-01-10 DIAGNOSIS — S0083XA Contusion of other part of head, initial encounter: Secondary | ICD-10-CM | POA: Diagnosis present

## 2020-01-10 DIAGNOSIS — Y999 Unspecified external cause status: Secondary | ICD-10-CM | POA: Insufficient documentation

## 2020-01-10 DIAGNOSIS — I10 Essential (primary) hypertension: Secondary | ICD-10-CM | POA: Diagnosis not present

## 2020-01-10 DIAGNOSIS — E119 Type 2 diabetes mellitus without complications: Secondary | ICD-10-CM | POA: Diagnosis not present

## 2020-01-10 DIAGNOSIS — W06XXXA Fall from bed, initial encounter: Secondary | ICD-10-CM | POA: Diagnosis not present

## 2020-01-10 DIAGNOSIS — W19XXXA Unspecified fall, initial encounter: Secondary | ICD-10-CM

## 2020-01-10 DIAGNOSIS — Y929 Unspecified place or not applicable: Secondary | ICD-10-CM | POA: Diagnosis not present

## 2020-01-10 DIAGNOSIS — S0003XA Contusion of scalp, initial encounter: Secondary | ICD-10-CM

## 2020-01-10 DIAGNOSIS — Z87891 Personal history of nicotine dependence: Secondary | ICD-10-CM | POA: Diagnosis not present

## 2020-01-10 LAB — BASIC METABOLIC PANEL
Anion gap: 11 (ref 5–15)
BUN: 29 mg/dL — ABNORMAL HIGH (ref 8–23)
CO2: 25 mmol/L (ref 22–32)
Calcium: 8.9 mg/dL (ref 8.9–10.3)
Chloride: 104 mmol/L (ref 98–111)
Creatinine, Ser: 1.59 mg/dL — ABNORMAL HIGH (ref 0.61–1.24)
GFR calc Af Amer: 42 mL/min — ABNORMAL LOW (ref >60)
GFR calc non Af Amer: 36 mL/min — ABNORMAL LOW (ref >60)
Glucose, Bld: 186 mg/dL — ABNORMAL HIGH (ref 70–99)
Potassium: 3.8 mmol/L (ref 3.5–5.1)
Sodium: 140 mmol/L (ref 135–145)

## 2020-01-10 LAB — CBC
HCT: 43.4 % (ref 39.0–52.0)
Hemoglobin: 14.3 g/dL (ref 13.0–17.0)
MCH: 32.2 pg (ref 26.0–34.0)
MCHC: 32.9 g/dL (ref 30.0–36.0)
MCV: 97.7 fL (ref 80.0–100.0)
Platelets: 207 10*3/uL (ref 150–400)
RBC: 4.44 MIL/uL (ref 4.22–5.81)
RDW: 13 % (ref 11.5–15.5)
WBC: 7.7 10*3/uL (ref 4.0–10.5)
nRBC: 0 % (ref 0.0–0.2)

## 2020-01-10 NOTE — ED Triage Notes (Signed)
Pt presents to ED BIB [facility] from [place] [facility]. Pt c/o headache and hematoma to R side of head after fall this evening. Pt had unwitnessed fall. Er pt he slipped and fell. No blood thinners, no LOC. Pt AAO at baseline.

## 2020-01-11 NOTE — Discharge Instructions (Signed)
Swelling and bruising of head/face should gradually go down.  Can try using ice packs to see if this can help. Tylenol or motrin for headache if that persists. Follow-up with your primary care doctor. Return here for any new/acute changes.

## 2020-01-11 NOTE — ED Notes (Signed)
Discharge instructions reviewed with pt and nurse at nursing facility. Pt and staff verbalized understanding.

## 2020-01-11 NOTE — ED Notes (Signed)
Called [facility] for transport back to [facility] @ [place]--Gary [name]

## 2020-01-11 NOTE — ED Provider Notes (Signed)
[name] EMERGENCY DEPARTMENT Provider Note   CSN: [id number] Arrival date & time: 01/10/20  2251     History Chief Complaint  Patient presents with  . Fall    Gary [name] is a 86 y.o. male.  The history is provided by the patient and medical records.  Fall    86 y.o. M with hx of GERD, anemia, AFIB not currently on anticoagulation, DM, HLP, HTN, presenting to the ED after a fall.  Patient is a resident at [employer] at [place].  States he was given some prune juice before bed and went to sit couple bedside table but accidentally knocked it in the floor.  States he tried to lean over to pick it up but subsequently fell out of the bed onto his head.  There was no loss of consciousness.  He reports pain along the right side of his head at area of impact.  No significant bleeding.  Tetanus UTD (given 2020 per chart review).  No meds PTA.  Past Medical History:  Diagnosis Date  . Acid reflux   . Anemia   . Atrial fibrillation (HCC)   . BPH (benign prostatic hyperplasia)   . Bursitis   . Constipation   . Diabetes mellitus   . Diverticulosis   . Edema   . Erectile dysfunction   . Hyperlipidemia   . Hypertension   . Renal disorder   . Thoracic discitis     Patient Active Problem List   Diagnosis Date Noted  . Hypertension   . Acid reflux   . Erectile dysfunction   . Atrial fibrillation (HCC)   . Edema   . Thoracic discitis     Past Surgical History:  Procedure Laterality Date  . BUNIONECTOMY Right   . TONSILLECTOMY    . TOTAL KNEE ARTHROPLASTY     L       Family History  Problem Relation Age of Onset  . Heart disease Mother     Social History   Tobacco Use  . Smoking status: Former Games developer  . Smokeless tobacco: Never Used  Substance Use Topics  . Alcohol use: Yes    Comment: Occasional  . Drug use: No    Home Medications Prior to Admission medications   Medication Sig Start Date End Date Taking? Authorizing Provider   albuterol (PROVENTIL HFA;VENTOLIN HFA) 108 (90 BASE) MCG/ACT inhaler Inhale 2 puffs into the lungs every 6 (six) hours as needed for wheezing or shortness of breath.    [provider]  amLODipine (NORVASC) 5 MG tablet Take 5 mg by mouth daily.    [provider]  ATORVASTATIN CALCIUM PO Take 10 mg by mouth daily.     [provider]  bisacodyl (DULCOLAX) 5 MG EC tablet Take 5 mg by mouth daily as needed for moderate constipation.    [provider]  cholecalciferol (VITAMIN D) 1000 units tablet Take 1,000 Units by mouth daily.    [provider]  diltiazem (DILACOR XR) 120 MG 24 hr capsule Take 120 mg by mouth daily.    [provider]  diphenhydrAMINE (BENADRYL) 25 MG tablet Take 1 tablet (25 mg total) by mouth every 6 (six) hours. 11/07/11 08/06/13  Pollyann Savoy, MD  docusate sodium (COLACE) 100 MG capsule Take 100 mg by mouth 2 (two) times daily.    [provider]  ergocalciferol (DRISDOL) 200 MCG/ML drops Take 2,000 Units by mouth daily.    [provider]  furosemide (LASIX) 20 MG tablet Take 20 mg by mouth as needed. 02/03/13   Lewayne Bunting, MD  glyBURIDE (DIABETA) 1.25 MG tablet Take 1.25 mg by mouth daily with breakfast.    [provider]  ipratropium (ATROVENT) 0.03 % nasal spray Place 2 sprays into both nostrils every 12 (twelve) hours.    [provider]  oxybutynin (DITROPAN-XL) 10 MG 24 hr tablet Take 10 mg by mouth at bedtime.    [provider]  pantoprazole (PROTONIX) 40 MG tablet Take 40 mg by mouth daily.    [provider]  polyethylene glycol (MIRALAX / GLYCOLAX) packet Take 1 packet in 8 ounce glass of water one to 2 times daily until results.  After that take 1 packet daily. 08/06/13   Nelva Nay, MD  Tamsulosin HCl (FLOMAX) 0.4 MG CAPS Take 0.4 mg by mouth daily.     [provider]  testosterone (ANDROGEL) 50 MG/5GM GEL Place 5 g onto the skin  daily.    [provider]    Allergies    Ace inhibitors, Aspirin, Ibuprofen, Keflex [cephalexin], Nsaids, and Rofecoxib  Review of Systems   Review of Systems  Skin: Positive for wound.  All other systems reviewed and are negative.   Physical Exam Updated Vital Signs BP 129/86 (BP Location: Left Arm)   Pulse 74   Temp 97.6 F (36.4 C) (Oral)   Resp 16   SpO2 95%   Physical Exam Vitals and nursing note reviewed.  Constitutional:      Appearance: He is well-developed.  HENT:     Head: Normocephalic and atraumatic.     Comments: Large hematoma of right forehead extending to right temple and right parietal scalp with overlying small skin tears but no open wound/laceration; there is bruising and edema of right orbital region, no deformity noted along orbital rim, mid-face stable, no epistaxis, no tenderness along nose, no dental injury noted Eyes:     Conjunctiva/sclera: Conjunctivae normal.     Pupils: Pupils are equal, round, and reactive to light.  Cardiovascular:     Rate and Rhythm: Normal rate and regular rhythm.     Heart sounds: Normal heart sounds.  Pulmonary:     Effort: Pulmonary effort is normal.     Breath sounds: Normal breath sounds.  Abdominal:     General: Bowel sounds are normal.     Palpations: Abdomen is soft.  Musculoskeletal:        General: Normal range of motion.     Cervical back: Normal range of motion.  Skin:    General: Skin is warm and dry.  Neurological:     Mental Status: He is alert and oriented to person, place, and time.     Comments: AAOx3, answering questions and following commands, moving arms and legs well, speech clear and goal oriented     ED Results / Procedures / Treatments   Labs (all labs ordered are listed, but only abnormal results are displayed) Labs Reviewed  BASIC METABOLIC PANEL - Abnormal; Notable for the following components:      Result Value   Glucose, Bld 186 (*)    BUN 29 (*)    Creatinine, Ser  1.59 (*)    GFR calc non Af Amer 36 (*)    GFR calc Af Amer 42 (*)    All other components within normal limits  CBC    EKG None  Radiology CT Head Wo Contrast  Result Date: 01/11/2020 CLINICAL DATA:  Unwitnessed fall, right hematoma EXAM: CT HEAD WITHOUT CONTRAST CT CERVICAL SPINE WITHOUT CONTRAST TECHNIQUE: Multidetector CT imaging of the head and cervical spine was performed following the standard protocol without intravenous contrast. Multiplanar CT image reconstructions of the cervical spine were also generated. COMPARISON:  CT 07/09/2018, radiograph 03/03/2009 FINDINGS: CT HEAD FINDINGS Brain: Region of remote appearing encephalomalacia does appear slightly expanded from the comparison study in 2020. Unlikely to be an acute change however. Additional lacunar type infarcts seen in the bilateral basal ganglia. No evidence of acute intracranial hemorrhage, CT evident acute infarction, hydrocephalus, extra-axial collection or mass lesion/mass effect. Symmetric prominence of the ventricles, cisterns and sulci compatible with parenchymal volume loss. Patchy areas of white matter hypoattenuation are most compatible with chronic microvascular angiopathy. Vascular: Atherosclerotic calcification of the carotid siphons and intradural vertebral arteries. No hyperdense vessel. Skull: Extensive right frontal scalp swelling and layering crescentic hematoma extending across the right frontoparietal convexity measuring up to 15 mm in maximal thickness with some mild overlying laceration suspected as well. Swelling extends across the supraorbital region in into the periorbital soft tissues with mild palpebral thickening. No subjacent calvarial fracture or acute fracture of the included facial bones. Stable parietal exostosis. 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 Benign left parietal bony excrescence, likely osteoma. Sinuses/Orbits:  Paranasal sinuses and mastoid air cells are predominantly clear. Orbital structures are unremarkable aside from prior lens extractions. Other: Debris in the external auditory canals.  Periodontal disease. CT CERVICAL SPINE FINDINGS Alignment: Stabilization collar is in place at the time of examination. Preservation of normal cervical lordosis. No evidence of traumatic listhesis. No abnormally widened, perched or jumped facets. Normal alignment of the craniocervical and atlantoaxial articulations. Skull base and vertebrae: No acute skull base fracture. No vertebral body fracture or height loss. Normal bone mineralization. No worrisome osseous lesions. Advanced arthrosis at the atlantodental and basion dens intervals. Multilevel spondylitic and facet degenerative changes as detailed below. Soft tissues and spinal canal: No pre or paravertebral fluid or swelling. No visible canal hematoma. Enthesopathic mineralization along the nuchal ligament. Metallic necklace resulting in streak artifact across the neck. Disc levels: Multilevel intervertebral disc height loss with spondylitic endplate changes. Multilevel disc osteophyte complexes partially efface the ventral thecal sac without significant canal stenosis. Uncinate spurring and facet hypertrophic changes are present as well resulting in at most mild multilevel foraminal narrowing. Upper chest: Calcified granuloma in the left upper lobe. No acute abnormality in the upper chest or imaged lung apices. Other: No concerning thyroid nodules. Cervical carotid atherosclerosis. IMPRESSION: 1. No evidence of acute intracranial abnormality. 2. Extensive right frontal scalp swelling and large layering crescentic hematoma extending across the right frontoparietal convexity measuring up to 15 mm in maximal thickness with some mild overlying laceration suspected as well. 3. Swelling also extends across the right periorbital soft tissues with associated palpebral thickening. No  orbital injury identified. 4. No subjacent calvarial fracture or acute facial fracture of the included facial bones. 5. Chronic microvascular ischemic changes, parenchymal volume loss and intracranial atherosclerosis. 6. 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 7. No acute fracture or traumatic listhesis of the cervical spine. 8. Multilevel spondylitic and facet degenerative changes of the cervical spine as described above. 9. Periodontal disease, correlate with dental exam. Electronically Signed   By: Kreg ShropshirePrice  DeHay M.D.   On: 01/11/2020 00:19  CT Cervical Spine Wo Contrast  Result Date: 01/11/2020 CLINICAL DATA:  Unwitnessed fall, right hematoma EXAM: CT HEAD WITHOUT CONTRAST CT CERVICAL SPINE WITHOUT CONTRAST TECHNIQUE: Multidetector CT imaging of the head and cervical spine was performed following the standard protocol without intravenous contrast. Multiplanar CT image reconstructions of the cervical spine were also generated. COMPARISON:  CT 07/09/2018, radiograph 03/03/2009 FINDINGS: CT HEAD FINDINGS Brain: Region of remote appearing encephalomalacia does appear slightly expanded from the comparison study in 2020. Unlikely to be an acute change however. Additional lacunar type infarcts seen in the bilateral basal ganglia. No evidence of acute intracranial hemorrhage, CT evident acute infarction, hydrocephalus, extra-axial collection or mass lesion/mass effect. Symmetric prominence of the ventricles, cisterns and sulci compatible with parenchymal volume loss. Patchy areas of white matter hypoattenuation are most compatible with chronic microvascular angiopathy. Vascular: Atherosclerotic calcification of the carotid siphons and intradural vertebral arteries. No hyperdense vessel. Skull: Extensive right frontal scalp swelling and layering crescentic hematoma extending across the right frontoparietal  convexity measuring up to 15 mm in maximal thickness with some mild overlying laceration suspected as well. Swelling extends across the supraorbital region in into the periorbital soft tissues with mild palpebral thickening. No subjacent calvarial fracture or acute fracture of the included facial bones. Stable parietal exostosis. 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 Benign left parietal bony excrescence, likely osteoma. Sinuses/Orbits: Paranasal sinuses and mastoid air cells are predominantly clear. Orbital structures are unremarkable aside from prior lens extractions. Other: Debris in the external auditory canals.  Periodontal disease. CT CERVICAL SPINE FINDINGS Alignment: Stabilization collar is in place at the time of examination. Preservation of normal cervical lordosis. No evidence of traumatic listhesis. No abnormally widened, perched or jumped facets. Normal alignment of the craniocervical and atlantoaxial articulations. Skull base and vertebrae: No acute skull base fracture. No vertebral body fracture or height loss. Normal bone mineralization. No worrisome osseous lesions. Advanced arthrosis at the atlantodental and basion dens intervals. Multilevel spondylitic and facet degenerative changes as detailed below. Soft tissues and spinal canal: No pre or paravertebral fluid or swelling. No visible canal hematoma. Enthesopathic mineralization along the nuchal ligament. Metallic necklace resulting in streak artifact across the neck. Disc levels: Multilevel intervertebral disc height loss with spondylitic endplate changes. Multilevel disc osteophyte complexes partially efface the ventral thecal sac without significant canal stenosis. Uncinate spurring and facet hypertrophic changes are present as well resulting in at most mild multilevel foraminal narrowing. Upper chest: Calcified granuloma in the left upper lobe. No acute  abnormality in the upper chest or imaged lung apices. Other: No concerning thyroid nodules. Cervical carotid atherosclerosis. IMPRESSION: 1. No evidence of acute intracranial abnormality. 2. Extensive right frontal scalp swelling and large layering crescentic hematoma extending across the right frontoparietal convexity measuring up to 15 mm in maximal thickness with some mild overlying laceration suspected as well. 3. Swelling also extends across the right periorbital soft tissues with associated palpebral thickening. No orbital injury identified. 4. No subjacent calvarial fracture or acute facial fracture of the included facial bones. 5. Chronic microvascular ischemic changes, parenchymal volume loss and intracranial atherosclerosis. 6. 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 7. No acute fracture or traumatic listhesis of the cervical spine. 8. Multilevel spondylitic and facet degenerative changes of the cervical spine as described above. 9. Periodontal disease, correlate with dental exam. Electronically Signed  By: Kreg Shropshire M.D.   On: 01/11/2020 00:19    Procedures Procedures (including critical care time)  Medications Ordered in ED Medications - No data to display  ED Course  I have reviewed the triage vital signs and the nursing notes.  Pertinent labs & imaging results that were available during my care of the patient were reviewed by me and considered in my medical decision making (see chart for details).    MDM Rules/Calculators/A&P  86 year old male presenting to the ED after a mechanical fall. He spilled some prune juice on the floor and went to pick up, and subsequently rolled out of the bed and landed on his head. There was no loss of consciousness. On exam, he does have very large hematoma to right forehead extending to right temple and right parietal scalp. There is some  small overlying small skin tears but no open wounds or lacerations. He has right periorbital ecchymosis and edema without orbital rim deformity. Midface is stable. Labs reassuring. CT of the head negative for acute intracranial bleed or skull fracture. His tetanus is up-to-date, given 2020 Per chart review. He remains alert and oriented to his baseline here. Feel he stable for discharge home.  Symptomatic care for swelling and/or headaches.  Follow-up with PCP.  Return here for any new/acute changes.  Final Clinical Impression(s) / ED Diagnoses Final diagnoses:  Fall, initial encounter  Hematoma of scalp, initial encounter    Rx / DC Orders ED Discharge Orders    None       Garlon Hatchet, PA-C 01/11/20 0600    Mesner, Barbara Cower, MD 01/11/20 [phone]

## 2020-11-06 DEATH — deceased
